# Patient Record
Sex: Female | Born: 1970 | Race: White | Hispanic: No | Marital: Married | Smoking: Former smoker
Health system: Southern US, Community
[De-identification: ages and names within clinical notes are randomized; demographics above are authoritative.]

## PROBLEM LIST (undated history)

## (undated) DIAGNOSIS — M75101 Unspecified rotator cuff tear or rupture of right shoulder, not specified as traumatic: Secondary | ICD-10-CM

## (undated) DIAGNOSIS — K200 Eosinophilic esophagitis: Secondary | ICD-10-CM

## (undated) DIAGNOSIS — D0472 Carcinoma in situ of skin of left lower limb, including hip: Secondary | ICD-10-CM

## (undated) DIAGNOSIS — M5030 Other cervical disc degeneration, unspecified cervical region: Secondary | ICD-10-CM

## (undated) DIAGNOSIS — K222 Esophageal obstruction: Secondary | ICD-10-CM

## (undated) DIAGNOSIS — F419 Anxiety disorder, unspecified: Secondary | ICD-10-CM

## (undated) DIAGNOSIS — F32A Depression, unspecified: Secondary | ICD-10-CM

## (undated) DIAGNOSIS — F329 Major depressive disorder, single episode, unspecified: Secondary | ICD-10-CM

## (undated) HISTORY — PX: OTHER SURGICAL HISTORY: SHX169

## (undated) HISTORY — PX: OVARIAN CYST SURGERY: SHX726

## (undated) HISTORY — DX: Depression, unspecified: F32.A

## (undated) HISTORY — DX: Major depressive disorder, single episode, unspecified: F32.9

## (undated) HISTORY — DX: Anxiety disorder, unspecified: F41.9

## (undated) HISTORY — PX: FOOT SURGERY: SHX648

---

## 2004-04-08 ENCOUNTER — Ambulatory Visit: Payer: Self-pay | Admitting: Podiatry

## 2010-04-10 ENCOUNTER — Emergency Department: Payer: Self-pay | Admitting: Emergency Medicine

## 2010-05-22 ENCOUNTER — Emergency Department: Payer: Self-pay | Admitting: Emergency Medicine

## 2013-06-26 ENCOUNTER — Ambulatory Visit: Payer: Self-pay | Admitting: Podiatry

## 2013-07-08 ENCOUNTER — Ambulatory Visit: Payer: Self-pay | Admitting: Obstetrics and Gynecology

## 2013-07-15 ENCOUNTER — Ambulatory Visit: Payer: Self-pay | Admitting: Obstetrics and Gynecology

## 2013-07-18 LAB — PATHOLOGY REPORT

## 2013-10-24 ENCOUNTER — Emergency Department: Payer: Self-pay | Admitting: Emergency Medicine

## 2014-02-12 ENCOUNTER — Emergency Department: Payer: Self-pay | Admitting: Emergency Medicine

## 2014-02-12 LAB — COMPREHENSIVE METABOLIC PANEL
ALK PHOS: 75 U/L
ALT: 24 U/L
ANION GAP: 9 (ref 7–16)
Albumin: 3.7 g/dL (ref 3.4–5.0)
BILIRUBIN TOTAL: 0.5 mg/dL (ref 0.2–1.0)
BUN: 13 mg/dL (ref 7–18)
CO2: 26 mmol/L (ref 21–32)
CREATININE: 0.78 mg/dL (ref 0.60–1.30)
Calcium, Total: 9 mg/dL (ref 8.5–10.1)
Chloride: 104 mmol/L (ref 98–107)
EGFR (Non-African Amer.): >60
GLUCOSE: 91 mg/dL (ref 65–99)
Osmolality: 277 (ref 275–301)
POTASSIUM: 4 mmol/L (ref 3.5–5.1)
SGOT(AST): 33 U/L (ref 15–37)
Sodium: 139 mmol/L (ref 136–145)
Total Protein: 7.3 g/dL (ref 6.4–8.2)

## 2014-02-12 LAB — CBC WITH DIFFERENTIAL/PLATELET
Basophil #: 0 10*3/uL (ref 0.0–0.1)
Basophil %: 0.6 %
EOS PCT: 2.8 %
Eosinophil #: 0.2 10*3/uL (ref 0.0–0.7)
HCT: 42.3 % (ref 35.0–47.0)
HGB: 13.9 g/dL (ref 12.0–16.0)
Lymphocyte #: 1.6 10*3/uL (ref 1.0–3.6)
Lymphocyte %: 28.6 %
MCH: 31.6 pg (ref 26.0–34.0)
MCHC: 32.8 g/dL (ref 32.0–36.0)
MCV: 96 fL (ref 80–100)
Monocyte #: 0.4 x10 3/mm (ref 0.2–0.9)
Monocyte %: 6.4 %
Neutrophil #: 3.3 10*3/uL (ref 1.4–6.5)
Neutrophil %: 61.6 %
Platelet: 226 10*3/uL (ref 150–440)
RBC: 4.4 10*6/uL (ref 3.80–5.20)
RDW: 12.3 % (ref 11.5–14.5)
WBC: 5.4 10*3/uL (ref 3.6–11.0)

## 2014-02-12 LAB — URINALYSIS, COMPLETE
BACTERIA: NONE SEEN
BILIRUBIN, UR: NEGATIVE
Blood: NEGATIVE
Glucose,UR: NEGATIVE mg/dL (ref 0–75)
Ketone: NEGATIVE
Leukocyte Esterase: NEGATIVE
Nitrite: NEGATIVE
Ph: 7 (ref 4.5–8.0)
Protein: NEGATIVE
Specific Gravity: 1.004 (ref 1.003–1.030)
WBC UR: <1 /HPF (ref 0–5)

## 2014-02-12 LAB — LIPASE, BLOOD: LIPASE: 118 U/L (ref 73–393)

## 2014-02-12 LAB — PREGNANCY, URINE: PREGNANCY TEST, URINE: NEGATIVE m[IU]/mL

## 2014-02-12 LAB — TROPONIN I: Troponin-I: <0.02 ng/mL

## 2014-09-19 NOTE — Op Note (Signed)
PATIENT NAME:  Kara Harrell, Kara Harrell MR#:  [id number] DATE OF BIRTH:  Jan 09, 1971  DATE OF PROCEDURE:  07/15/2013  PREOPERATIVE DIAGNOSIS: Pelvic pain.   POSTOPERATIVE DIAGNOSIS: Pelvic pain.  OPERATION PERFORMED: Diagnostic laparoscopy, left ovarian cystectomy, and uterine serosal biopsy.   ANESTHESIA USED: General.   PRIMARY SURGEON: Stoney Bang. Georgianne Fick, MD  ESTIMATED BLOOD LOSS: 10 mL.  OPERATIVE FLUIDS: 1100 mL of crystalloid.   URINE OUTPUT: 200 mL.   COMPLICATIONS: None.   SPECIMENS REMOVED: Left ovarian cyst as well as uterine serosal biopsy.   INTRAOPERATIVE FINDINGS: Normal uterus, other than some plaquelike hyperpigmented lesions on the posterior fundal aspect of the uterus, which were biopsied. Normal right ovary. Left ovary with a simple-appearing cyst. Anterior cul-de-sac with minimal scarring at the site of the prior hysterotomy scar. Posterior cul-de-sac and uterosacral ligaments were clear. Both ovarian fossae were visualized and appeared grossly normal. Ureters were visualized, normal caliber and peristalsing normally. Appendix visualized, normal. The liver edge was visualized and grossly normal. There was no evidence of any hernias or other etiology for the patient's pelvic pain.   PATIENT CONDITION FOLLOWING THE PROCEDURE: Stable.   PROCEDURE IN DETAIL: Risks, benefits, and alternatives of this procedure were discussed with the patient prior to proceeding to the operating room. The patient was taken to the operating room where she was placed under general anesthesia. She was positioned in the dorsal lithotomy position using Allen stirrups, prepped and draped in the usual sterile fashion. A timeout was performed. Attention was turned to the patient's pelvis. The patient's bladder was straight catheterized with a red rubber catheter, returning 200 mL of clear urine. Operative speculum was placed. The anterior lip of the cervix was visualized, grasped with a single-tooth  tenaculum, and a Hulka tenaculum was placed without difficulty. The single-tooth tenaculum and operative speculum were removed. Attention was turned to the patient's abdomen. The umbilicus was infiltrated with 1% lidocaine. A stab incision was made at the base of the umbilicus, and a 5 mm XCEL trocar was used to gain entry into the peritoneal cavity using direct visualization. Following this, 2 lateral assistant ports were placed under visualization. These were also 5 mm XCEL ports. Inspection of the abdomen revealed the above findings. Approximately a 3 to 4 cm left adnexal cyst on the left ovary was incised using a 5 mm Harmonic. The cyst wall was peeled out of the cyst cavity and sent to pathology for analysis. The uterine serosal plaques were biopsied using biopsy forceps. The pelvis was irrigated. Hemostasis of the ovary was achieved using a bipolar cautery. Pneumoperitoneum was evacuated. The trocar sites were removed. Sponge, needle, and instrument counts were correct x 2. Each of the trocar sites was dressed with Dermabond.     ____________________________ Stoney Bang. Georgianne Fick, MD ams:jcm D: 07/15/2013 20:56:39 ET T: 07/15/2013 21:28:18 ET JOB#: [id number]  cc: Stoney Bang. Georgianne Fick, MD, <Dictator> Conan Bowens Madelon Lips MD ELECTRONICALLY SIGNED 07/16/2013 1:01

## 2014-11-09 ENCOUNTER — Ambulatory Visit: Payer: Self-pay | Admitting: Family Medicine

## 2014-11-11 ENCOUNTER — Ambulatory Visit (INDEPENDENT_AMBULATORY_CARE_PROVIDER_SITE_OTHER): Payer: 59 | Admitting: Family Medicine

## 2014-11-11 ENCOUNTER — Encounter: Payer: Self-pay | Admitting: Family Medicine

## 2014-11-11 VITALS — BP 110/60 | HR 89 | Resp 16 | Ht 61.5 in | Wt 98.0 lb

## 2014-11-11 DIAGNOSIS — S2000XA Contusion of breast, unspecified breast, initial encounter: Secondary | ICD-10-CM

## 2014-11-11 DIAGNOSIS — Z8619 Personal history of other infectious and parasitic diseases: Secondary | ICD-10-CM | POA: Insufficient documentation

## 2014-11-11 DIAGNOSIS — N6489 Other specified disorders of breast: Secondary | ICD-10-CM | POA: Diagnosis not present

## 2014-11-11 DIAGNOSIS — F329 Major depressive disorder, single episode, unspecified: Secondary | ICD-10-CM | POA: Insufficient documentation

## 2014-11-11 DIAGNOSIS — F418 Other specified anxiety disorders: Secondary | ICD-10-CM

## 2014-11-11 DIAGNOSIS — F419 Anxiety disorder, unspecified: Principal | ICD-10-CM

## 2014-11-11 MED ORDER — CLONAZEPAM 0.5 MG PO TABS: 0.2500 mg | ORAL_TABLET | Freq: Three times a day (TID) | ORAL | Status: DC | PRN

## 2014-11-11 MED ORDER — ESCITALOPRAM OXALATE 20 MG PO TABS: 20.0000 mg | ORAL_TABLET | Freq: Every day | ORAL | Status: DC

## 2014-11-11 NOTE — Progress Notes (Signed)
Name: Kara Harrell   MRN: [id number]    DOB: [birth year]   Date:11/11/2014       Progress Note  Subjective  Chief Complaint  Chief Complaint  Patient presents with  . Anxiety  . Depression  . Breast Pain    After fall 4 days ago     Anxiety Presents for follow-up visit. The problem has been gradually improving. Symptoms include chest pain (chest wall pain from traumatic injury), depressed mood and nervous/anxious behavior. Patient reports no feeling of choking or insomnia. Symptoms occur most days. The severity of symptoms is moderate. The symptoms are aggravated by family issues. The quality of sleep is good.   Past treatments include benzodiazephines and SSRIs. The treatment provided significant relief. Compliance with prior treatments has been good.  Depression Patient is here for follow up of Depression. Symptoms include depressed mood and frequent crying. Currently on Lexapro 20 mg daily, which seems to help with her symptoms. SHe is able to 'cope' a little better when taking Lexapro compared to prior.  Pt. Also sustained a blunt trauma to her right chest wall and right breast 5 days ago. She tried to hold onto her friend who was leaning [place] rail [place] the [place] and in the process hit her right front chest and right breast on the wooden rail. SHe experienced chest wall pain and bruising afterwards which has improved. She is taking Aleve for pain and inflammation.   Past Medical History  Diagnosis Date  . Anxiety   . Depression     Past Surgical History  Procedure Laterality Date  . Foot surgery      Family History  Problem Relation Age of Onset  . Cancer Mother     Lung  . Diabetes Sister     History   Social History  . Marital Status: Married    Spouse Name: N/A  . Number of Children: N/A  . Years of Education: N/A   Occupational History  . Not on file.   Social History Main Topics  . Smoking status: Current Every Day Smoker -- 0.50 packs/day   Types: Cigarettes  . Smokeless tobacco: Never Used  . Alcohol Use: 3.0 oz/week    5 Cans of beer per week  . Drug Use: No  . Sexual Activity: Not on file   Other Topics Concern  . Not on file   Social History Narrative  . No narrative on file     Current outpatient prescriptions:  .  clonazePAM (KLONOPIN) 0.5 MG tablet, Take 1 tablet by mouth 3 (three) times daily., Disp: , Rfl:  .  escitalopram (LEXAPRO) 10 MG tablet, Take 1 tablet by mouth daily., Disp: , Rfl:   Allergies  Allergen Reactions  . Oxycodone      Review of Systems  Respiratory: Positive for cough. Negative for wheezing.   Cardiovascular: Positive for chest pain (chest wall pain from traumatic injury).  Psychiatric/Behavioral: Positive for depression. The patient is nervous/anxious. The patient does not have insomnia.       Objective  Filed Vitals:   11/11/14 1033  BP: 110/60  Pulse: 89  Resp: 16  Height: 5' 1.5" (1.562 m)  Weight: 98 lb (44.453 kg)  SpO2: 98%    Physical Exam  Constitutional: She is oriented to person, place, and time and well-developed, well-nourished, and in no distress.  HENT:  Head: Normocephalic and atraumatic.  Cardiovascular: Normal rate and regular rhythm.   Pulmonary/Chest: Effort normal and breath sounds  normal. Right breast exhibits tenderness.    Bruising, tenderness, and swelling over the superior portion of right breast along with mild chest wall tenderness.  Neurological: She is alert and oriented to person, place, and time.  Psychiatric: Memory, affect and judgment normal.  Nursing note and vitals reviewed.      No results found for this or any previous visit (from the past 2160 hour(s)).   Assessment & Plan 1. Anxiety and depression Symptoms are stable on present therapy. Continue medications as prescribed. - clonazePAM (KLONOPIN) 0.5 MG tablet; Take 0.5 tablets (0.25 mg total) by mouth 3 (three) times daily as needed for anxiety (0.25-0.94m TID PRN  Anxiety/Insomnia).  Dispense: 90 tablet; Refill: 0 - escitalopram (LEXAPRO) 20 MG tablet; Take 1 tablet (20 mg total) by mouth at bedtime.  Dispense: 90 tablet; Refill: 0  2. Bruise of breast Brusing with accompanying swelling, which is improving. Patient was asked to apply ice packs to her chest wall and superior breast area and if she still has tenderness, she is to return next week for follow-up for consideration of a diagnostic ultrasound of right breast. She is to continue taking Aleve as needed for relief of pain and inflammation.    Rhyen Mazariego Asad A. [facility] 11/11/2014 10:44 AM

## 2015-02-15 ENCOUNTER — Ambulatory Visit: Payer: 59 | Admitting: Family Medicine

## 2015-03-03 ENCOUNTER — Ambulatory Visit (INDEPENDENT_AMBULATORY_CARE_PROVIDER_SITE_OTHER): Payer: 59 | Admitting: Family Medicine

## 2015-03-03 ENCOUNTER — Encounter: Payer: Self-pay | Admitting: Family Medicine

## 2015-03-03 VITALS — BP 116/78 | HR 94 | Temp 98.6°F | Resp 16 | Ht 62.0 in | Wt 97.6 lb

## 2015-03-03 DIAGNOSIS — F418 Other specified anxiety disorders: Secondary | ICD-10-CM | POA: Diagnosis not present

## 2015-03-03 DIAGNOSIS — F329 Major depressive disorder, single episode, unspecified: Secondary | ICD-10-CM

## 2015-03-03 DIAGNOSIS — F419 Anxiety disorder, unspecified: Principal | ICD-10-CM

## 2015-03-03 MED ORDER — CLONAZEPAM 0.5 MG PO TABS: 0.2500 mg | ORAL_TABLET | Freq: Three times a day (TID) | ORAL | Status: DC | PRN

## 2015-03-03 MED ORDER — ESCITALOPRAM OXALATE 20 MG PO TABS: 20.0000 mg | ORAL_TABLET | Freq: Every day | ORAL | Status: DC

## 2015-03-03 NOTE — Progress Notes (Signed)
Name: Kara Harrell   MRN: [id number]    DOB: December 22, 1970   Date:03/03/2015       Progress Note  Subjective  Chief Complaint  Chief Complaint  Patient presents with  . Anxiety    pt here for 3 month follow up  . Depression   Anxiety Presents for follow-up visit. Symptoms include insomnia, irritability and nervous/anxious behavior. Patient reports no chest pain. The symptoms are aggravated by family issues.   Risk factors include a major life event. Past treatments include benzodiazephines.  Depression        This is a chronic problem.  Associated symptoms include fatigue, hopelessness, insomnia, irritable and sad.  Past treatments include SSRIs - Selective serotonin reuptake inhibitors.  Compliance with treatment is good.  Previous treatment provided moderate relief.  Risk factors include major life event and stress.   Past medical history includes anxiety.     Past Medical History  Diagnosis Date  . Anxiety   . Depression     Past Surgical History  Procedure Laterality Date  . Foot surgery      Family History  Problem Relation Age of Onset  . Cancer Mother     Lung  . Diabetes Sister     Social History   Social History  . Marital Status: Married    Spouse Name: N/A  . Number of Children: N/A  . Years of Education: N/A   Occupational History  . Not on file.   Social History Main Topics  . Smoking status: Current Every Day Smoker -- 0.50 packs/day    Types: Cigarettes  . Smokeless tobacco: Never Used  . Alcohol Use: 3.0 oz/week    5 Cans of beer per week  . Drug Use: No  . Sexual Activity: Not on file   Other Topics Concern  . Not on file   Social History Narrative    Current outpatient prescriptions:  .  clonazePAM (KLONOPIN) 0.5 MG tablet, Take 0.5 tablets (0.25 mg total) by mouth 3 (three) times daily as needed for anxiety (0.25-0.54m TID PRN Anxiety/Insomnia)., Disp: 90 tablet, Rfl: 0 .  escitalopram (LEXAPRO) 20 MG tablet, Take 1 tablet (20 mg  total) by mouth at bedtime., Disp: 90 tablet, Rfl: 0  Allergies  Allergen Reactions  . Oxycodone    Review of Systems  Constitutional: Positive for irritability and fatigue.  Cardiovascular: Negative for chest pain.  Psychiatric/Behavioral: Positive for depression. The patient is nervous/anxious and has insomnia.    Objective  Filed Vitals:   03/03/15 1011  BP: 116/78  Pulse: 94  Temp: 98.6 F (37 C)  Resp: 16  Height: 5' 2"  (1.575 m)  Weight: 97 lb 9 oz (44.254 kg)  SpO2: 97%    Physical Exam  Constitutional: She is oriented to person, place, and time and well-developed, well-nourished, and in no distress. She is irritable.  Cardiovascular: Normal rate and regular rhythm.   Pulmonary/Chest: Effort normal and breath sounds normal.  Neurological: She is alert and oriented to person, place, and time.  Psychiatric: Affect and judgment normal.  Nursing note and vitals reviewed.  Assessment & Plan  1. Anxiety and depression  - escitalopram (LEXAPRO) 20 MG tablet; Take 1 tablet (20 mg total) by mouth at bedtime.  Dispense: 90 tablet; Refill: 0 - clonazePAM (KLONOPIN) 0.5 MG tablet; Take 0.5 tablets (0.25 mg total) by mouth 3 (three) times daily as needed for anxiety (0.25-0.542mTID PRN Anxiety/Insomnia).  Dispense: 90 tablet; Refill: 0   SyOkey Dupre  [facility] 03/03/2015 10:37 AM

## 2015-06-01 ENCOUNTER — Ambulatory Visit (INDEPENDENT_AMBULATORY_CARE_PROVIDER_SITE_OTHER): Payer: 59 | Admitting: Family Medicine

## 2015-06-01 ENCOUNTER — Encounter: Payer: Self-pay | Admitting: Family Medicine

## 2015-06-01 VITALS — BP 114/70 | HR 85 | Temp 98.0°F | Resp 18 | Ht 62.0 in | Wt 104.8 lb

## 2015-06-01 DIAGNOSIS — F329 Major depressive disorder, single episode, unspecified: Secondary | ICD-10-CM

## 2015-06-01 DIAGNOSIS — F4321 Adjustment disorder with depressed mood: Secondary | ICD-10-CM | POA: Insufficient documentation

## 2015-06-01 DIAGNOSIS — F32A Depression, unspecified: Secondary | ICD-10-CM

## 2015-06-01 DIAGNOSIS — J209 Acute bronchitis, unspecified: Secondary | ICD-10-CM | POA: Insufficient documentation

## 2015-06-01 DIAGNOSIS — F419 Anxiety disorder, unspecified: Secondary | ICD-10-CM | POA: Diagnosis not present

## 2015-06-01 MED ORDER — PREDNISONE 10 MG (21) PO TBPK: 10.0000 mg | ORAL_TABLET | Freq: Every day | ORAL | Status: DC

## 2015-06-01 MED ORDER — ESCITALOPRAM OXALATE 20 MG PO TABS: 20.0000 mg | ORAL_TABLET | Freq: Every day | ORAL | Status: DC

## 2015-06-01 MED ORDER — BENZONATATE 200 MG PO CAPS: 200.0000 mg | ORAL_CAPSULE | Freq: Three times a day (TID) | ORAL | Status: DC | PRN

## 2015-06-01 MED ORDER — AZITHROMYCIN 250 MG PO TABS: ORAL_TABLET | ORAL | Status: DC

## 2015-06-01 NOTE — Progress Notes (Signed)
Name: Kara Harrell   MRN: [id number]    DOB: 01/24/1971   Date:06/01/2015       Progress Note  Subjective  Chief Complaint  Chief Complaint  Patient presents with  . Follow-up    3 mo  . Anxiety    Anxiety Presents for follow-up visit. The problem has been gradually improving. Symptoms include chest pain, depressed mood, irritability, nervous/anxious behavior, panic and shortness of breath. Patient reports no insomnia, restlessness or suicidal ideas.   Past treatments include benzodiazephines. The treatment provided significant relief. Compliance with prior treatments has been good.  Cough This is a recurrent problem. Episode onset: 10 days. The cough is productive of sputum. Associated symptoms include chest pain, ear pain, postnasal drip, shortness of breath and wheezing. Pertinent negatives include no chills, fever, headaches or sore throat. She has tried OTC cough suppressant (Alka Seltzer day and night, Mucinex DM 12 hrs.) for the symptoms.  Depression        This is a chronic problem.  The problem has been gradually improving since onset.  Associated symptoms include hopelessness and sad.  Associated symptoms include does not have insomnia, no restlessness, no headaches and no suicidal ideas.     The symptoms are aggravated by family issues.  Past treatments include SSRIs - Selective serotonin reuptake inhibitors.  Past medical history includes anxiety.     Past Medical History  Diagnosis Date  . Anxiety   . Depression     Past Surgical History  Procedure Laterality Date  . Foot surgery      Family History  Problem Relation Age of Onset  . Cancer Mother     Lung  . Diabetes Sister     Social History   Social History  . Marital Status: Married    Spouse Name: N/A  . Number of Children: N/A  . Years of Education: N/A   Occupational History  . Not on file.   Social History Main Topics  . Smoking status: Current Every Day Smoker -- 0.50 packs/day    Types:  Cigarettes  . Smokeless tobacco: Never Used  . Alcohol Use: 3.0 oz/week    5 Cans of beer per week  . Drug Use: No  . Sexual Activity: Not on file   Other Topics Concern  . Not on file   Social History Narrative     Current outpatient prescriptions:  .  clonazePAM (KLONOPIN) 0.5 MG tablet, Take 0.5 tablets (0.25 mg total) by mouth 3 (three) times daily as needed for anxiety (0.25-0.70m TID PRN Anxiety/Insomnia)., Disp: 90 tablet, Rfl: 0 .  escitalopram (LEXAPRO) 20 MG tablet, Take 1 tablet (20 mg total) by mouth at bedtime., Disp: 90 tablet, Rfl: 0  Allergies  Allergen Reactions  . Oxycodone     Review of Systems  Constitutional: Positive for irritability. Negative for fever and chills.  HENT: Positive for congestion, ear pain and postnasal drip. Negative for sore throat.   Respiratory: Positive for cough, sputum production, shortness of breath and wheezing.   Cardiovascular: Positive for chest pain.  Neurological: Negative for headaches.  Psychiatric/Behavioral: Positive for depression. Negative for suicidal ideas. The patient is nervous/anxious. The patient does not have insomnia.     Objective  Filed Vitals:   06/01/15 0923  BP: 114/70  Pulse: 85  Temp: 98 F (36.7 C)  TempSrc: Oral  Resp: 18  Height: 5' 2"  (1.575 m)  Weight: 104 lb 12.8 oz (47.537 kg)  SpO2: 97%    Physical  Exam  Constitutional: She is oriented to person, place, and time and well-developed, well-nourished, and in no distress.  HENT:  Right Ear: Tympanic membrane and ear canal normal.  Left Ear: Tympanic membrane and ear canal normal.  Nose: Right sinus exhibits maxillary sinus tenderness. Left sinus exhibits maxillary sinus tenderness.  Mouth/Throat: Mucous membranes are normal. Posterior oropharyngeal erythema present.  Cardiovascular: Normal rate and regular rhythm.   Pulmonary/Chest: Effort normal and breath sounds normal.  Neurological: She is alert and oriented to person, place, and  time.  Psychiatric: Affect and judgment normal.  Nursing note and vitals reviewed.    Assessment & Plan  1. Anxiety Stable on Clonazepam 0.25-0.75m three times daily as needed.  2. Depression  - escitalopram (LEXAPRO) 20 MG tablet; Take 1 tablet (20 mg total) by mouth at bedtime.  Dispense: 90 tablet; Refill: 0  3. Acute bronchitis, unspecified organism  - azithromycin (ZITHROMAX) 250 MG tablet; 2 tabs po x day 1, then 1 tab po q day x 4 days  Dispense: 6 tablet; Refill: 0 - benzonatate (TESSALON) 200 MG capsule; Take 1 capsule (200 mg total) by mouth 3 (three) times daily as needed for cough.  Dispense: 20 capsule; Refill: 0 - predniSONE (STERAPRED UNI-PAK 21 TAB) 10 MG (21) TBPK tablet; Take 1 tablet (10 mg total) by mouth daily. 60 50 40 30 20 10  then STOP  Dispense: 21 tablet; Refill: 0   Tasneem Cormier Asad A. [facility] 06/01/2015 9:49 AM

## 2015-06-30 ENCOUNTER — Telehealth: Payer: Self-pay

## 2015-06-30 NOTE — Telephone Encounter (Signed)
Patient is experiencing coughing, fevers, "feels bad ". Symptoms present for over 2 days but much worse today. Requesting that an antibiotic be called into her pharmacy. I recommended that patient should be seen either tonight or tomorrow morning at urgent care for evaluation of her symptoms and the prescription of appropriate therapy. Verbalized agreement.

## 2015-06-30 NOTE — Telephone Encounter (Signed)
Routed to Dr. Manuella Ghazi for advice

## 2015-07-07 ENCOUNTER — Ambulatory Visit: Payer: 59 | Admitting: Family Medicine

## 2015-07-08 ENCOUNTER — Ambulatory Visit
Admission: RE | Admit: 2015-07-08 | Discharge: 2015-07-08 | Disposition: A | Discharge status: Home / Self Care | Payer: 59 | Source: Ambulatory Visit | Attending: Family Medicine | Admitting: Family Medicine

## 2015-07-08 ENCOUNTER — Encounter: Payer: Self-pay | Admitting: Family Medicine

## 2015-07-08 ENCOUNTER — Ambulatory Visit (INDEPENDENT_AMBULATORY_CARE_PROVIDER_SITE_OTHER): Payer: 59 | Admitting: Family Medicine

## 2015-07-08 VITALS — BP 112/70 | HR 116 | Temp 98.9°F | Resp 22 | Ht 62.0 in | Wt 104.9 lb

## 2015-07-08 DIAGNOSIS — R0602 Shortness of breath: Secondary | ICD-10-CM | POA: Insufficient documentation

## 2015-07-08 DIAGNOSIS — R05 Cough: Secondary | ICD-10-CM | POA: Insufficient documentation

## 2015-07-08 DIAGNOSIS — R059 Cough, unspecified: Secondary | ICD-10-CM

## 2015-07-08 MED ORDER — GUAIFENESIN-CODEINE 100-10 MG/5ML PO SYRP: 10.0000 mL | ORAL_SOLUTION | Freq: Four times a day (QID) | ORAL | Status: DC | PRN

## 2015-07-08 NOTE — Progress Notes (Signed)
Name: Kara Harrell   MRN: [id number]    DOB: 01-22-1971   Date:07/08/2015       Progress Note  Subjective  Chief Complaint  Chief Complaint  Patient presents with  . Acute Visit    Possible Pneumonia    Cough This is a new (Persistent cough X 6 weeks) problem. The cough is non-productive. Associated symptoms include chest pain (rib apin and shoulder blade pain on the right side.), chills, a fever (mainly at night), headaches and shortness of breath. Pertinent negatives include no sore throat. Treatments tried: Zithromax and Benzonatate for Sinusitis.   Schendt seen at the urgent care, was started on Z-Pak, and Tessalon for sinusitis.  Past Medical History  Diagnosis Date  . Anxiety   . Depression     Past Surgical History  Procedure Laterality Date  . Foot surgery      Family History  Problem Relation Age of Onset  . Cancer Mother     Lung  . Diabetes Sister     Social History   Social History  . Marital Status: Married    Spouse Name: N/A  . Number of Children: N/A  . Years of Education: N/A   Occupational History  . Not on file.   Social History Main Topics  . Smoking status: Current Every Day Smoker -- 0.50 packs/day    Types: Cigarettes  . Smokeless tobacco: Never Used  . Alcohol Use: 3.0 oz/week    5 Cans of beer per week  . Drug Use: No  . Sexual Activity: Not on file   Other Topics Concern  . Not on file   Social History Narrative     Current outpatient prescriptions:  .  albuterol (PROAIR HFA) 108 (90 Base) MCG/ACT inhaler, , Disp: , Rfl:  .  clonazePAM (KLONOPIN) 0.5 MG tablet, Take 0.5 tablets (0.25 mg total) by mouth 3 (three) times daily as needed for anxiety (0.25-0.81m TID PRN Anxiety/Insomnia)., Disp: 90 tablet, Rfl: 0 .  escitalopram (LEXAPRO) 20 MG tablet, Take 1 tablet (20 mg total) by mouth at bedtime., Disp: 90 tablet, Rfl: 0  Allergies  Allergen Reactions  . Oxycodone      Review of Systems  Constitutional: Positive for  fever (mainly at night) and chills.  HENT: Negative for sore throat.   Respiratory: Positive for cough and shortness of breath.   Cardiovascular: Positive for chest pain (rib apin and shoulder blade pain on the right side.).  Neurological: Positive for headaches.    Objective  Filed Vitals:   07/08/15 1345  BP: 112/70  Pulse: 116  Temp: 98.9 F (37.2 C)  TempSrc: Oral  Resp: 22  Height: 5' 2"  (1.575 m)  Weight: 104 lb 14.4 oz (47.582 kg)  SpO2: 97%    Physical Exam  Constitutional: She is well-developed, well-nourished, and in no distress.  HENT:  Nose: Right sinus exhibits maxillary sinus tenderness. Left sinus exhibits maxillary sinus tenderness.  Neck: Neck supple.  Cardiovascular: S1 normal, S2 normal and normal heart sounds.   Pulmonary/Chest: She has decreased breath sounds in the right middle field. She has wheezes in the right upper field, the right middle field and the right lower field. She has no rhonchi.  Nursing note and vitals reviewed.    Assessment & Plan  1. Cough Likely bronchitis. Rule out pneumonia by obtaining CXR. Started on Cheratussin and will follow-up. No further antibiotic therapy at this time. - DG Chest 2 View; Future - guaiFENesin-codeine (CHERATUSSIN AC) 100-10 MG/5ML  syrup; Take 10 mLs by mouth 4 (four) times daily as needed for cough.  Dispense: 200 mL; Refill: 0 - CBC with Differential   Marvine Encalade Asad A. [facility] 07/08/2015 2:03 PM

## 2015-07-09 ENCOUNTER — Telehealth: Payer: Self-pay | Admitting: Family Medicine

## 2015-07-09 LAB — CBC WITH DIFFERENTIAL/PLATELET
BASOS: 1 %
Basophils Absolute: 0 10*3/uL (ref 0.0–0.2)
EOS (ABSOLUTE): 0.2 10*3/uL (ref 0.0–0.4)
EOS: 3 %
HEMATOCRIT: 39.8 % (ref 34.0–46.6)
Hemoglobin: 13.5 g/dL (ref 11.1–15.9)
IMMATURE GRANS (ABS): 0 10*3/uL (ref 0.0–0.1)
IMMATURE GRANULOCYTES: 0 %
LYMPHS: 35 %
Lymphocytes Absolute: 2.1 10*3/uL (ref 0.7–3.1)
MCH: 32 pg (ref 26.6–33.0)
MCHC: 33.9 g/dL (ref 31.5–35.7)
MCV: 94 fL (ref 79–97)
Monocytes Absolute: 0.3 10*3/uL (ref 0.1–0.9)
Monocytes: 6 %
NEUTROS ABS: 3.2 10*3/uL (ref 1.4–7.0)
NEUTROS PCT: 55 %
Platelets: 319 10*3/uL (ref 150–379)
RBC: 4.22 x10E6/uL (ref 3.77–5.28)
RDW: 12.9 % (ref 12.3–15.4)
WBC: 5.9 10*3/uL (ref 3.4–10.8)

## 2015-07-09 NOTE — Telephone Encounter (Signed)
Pt wants to know if we have received her results and also wants to know if she can get the medication called in that Dr Manuella Ghazi was going to call depending on the results. Pt states she is feeling really bad and would like a call back.

## 2015-07-12 ENCOUNTER — Ambulatory Visit
Admission: EM | Admit: 2015-07-12 | Discharge: 2015-07-12 | Disposition: A | Discharge status: Home / Self Care | Payer: 59 | Attending: Family Medicine | Admitting: Family Medicine

## 2015-07-12 DIAGNOSIS — F17210 Nicotine dependence, cigarettes, uncomplicated: Secondary | ICD-10-CM | POA: Diagnosis not present

## 2015-07-12 DIAGNOSIS — R079 Chest pain, unspecified: Secondary | ICD-10-CM | POA: Insufficient documentation

## 2015-07-12 DIAGNOSIS — R0789 Other chest pain: Secondary | ICD-10-CM

## 2015-07-12 DIAGNOSIS — F419 Anxiety disorder, unspecified: Secondary | ICD-10-CM | POA: Diagnosis not present

## 2015-07-12 DIAGNOSIS — Z720 Tobacco use: Secondary | ICD-10-CM

## 2015-07-12 DIAGNOSIS — R21 Rash and other nonspecific skin eruption: Secondary | ICD-10-CM | POA: Insufficient documentation

## 2015-07-12 DIAGNOSIS — F329 Major depressive disorder, single episode, unspecified: Secondary | ICD-10-CM | POA: Diagnosis not present

## 2015-07-12 DIAGNOSIS — J209 Acute bronchitis, unspecified: Secondary | ICD-10-CM | POA: Diagnosis not present

## 2015-07-12 DIAGNOSIS — J069 Acute upper respiratory infection, unspecified: Secondary | ICD-10-CM | POA: Diagnosis present

## 2015-07-12 MED ORDER — MELOXICAM 15 MG PO TABS: 15.0000 mg | ORAL_TABLET | Freq: Every day | ORAL | Status: DC

## 2015-07-12 MED ORDER — HYDROCOD POLST-CPM POLST ER 10-8 MG/5ML PO SUER: 5.0000 mL | Freq: Two times a day (BID) | ORAL | Status: DC | PRN

## 2015-07-12 MED ORDER — LEVOFLOXACIN 500 MG PO TABS: 500.0000 mg | ORAL_TABLET | Freq: Every day | ORAL | Status: DC

## 2015-07-12 MED ORDER — PREDNISONE 10 MG (21) PO TBPK: ORAL_TABLET | ORAL | Status: DC

## 2015-07-12 NOTE — ED Notes (Signed)
Patient c/o cough, nasal congestion, headache, and body aches which started in December.  She has been experiencing fever/n/v/c.

## 2015-07-12 NOTE — Discharge Instructions (Signed)
Acute Bronchitis Bronchitis is when the airways that extend from the windpipe into the lungs get red, puffy, and painful (inflamed). Bronchitis often causes thick spit (mucus) to develop. This leads to a cough. A cough is the most common symptom of bronchitis. In acute bronchitis, the condition usually begins suddenly and goes away over time (usually in 2 weeks). Smoking, allergies, and asthma can make bronchitis worse. Repeated episodes of bronchitis may cause more lung problems. HOME CARE  Rest.  Drink enough fluids to keep your pee (urine) clear or pale yellow (unless you need to limit fluids as told by your doctor).  Only take over-the-counter or prescription medicines as told by your doctor.  Avoid smoking and secondhand smoke. These can make bronchitis worse. If you are a smoker, think about using nicotine gum or skin patches. Quitting smoking will help your lungs heal faster.  Reduce the chance of getting bronchitis again by:  Washing your hands often.  Avoiding people with cold symptoms.  Trying not to touch your hands to your mouth, nose, or eyes.  Follow up with your doctor as told. GET HELP IF: Your symptoms do not improve after 1 week of treatment. Symptoms include:  Cough.  Fever.  Coughing up thick spit.  Body aches.  Chest congestion.  Chills.  Shortness of breath.  Sore throat. GET HELP RIGHT AWAY IF:   You have an increased fever.  You have chills.  You have severe shortness of breath.  You have bloody thick spit (sputum).  You throw up (vomit) often.  You lose too much body fluid (dehydration).  You have a severe headache.  You faint. MAKE SURE YOU:   Understand these instructions.  Will watch your condition.  Will get help right away if you are not doing well or get worse.   This information is not intended to replace advice given to you by your health care provider. Make sure you discuss any questions you have with your health care  provider.   Document Released: 11/01/2007 Document Revised: 01/15/2013 Document Reviewed: 11/05/2012 Elsevier Interactive Patient Education 2016 [place] WHAT IS SECONDHAND SMOKE? Secondhand smoke is smoke that comes from burning tobacco. It could be the smoke from a cigarette, a pipe, or a cigar. Even if you are not the one smoking, secondhand smoke exposes you to the dangers of smoking. This is called involuntary, or passive, smoking. There are two types of secondhand smoke:  Sidestream smoke is the smoke that comes off the lighted end of a cigarette, pipe, or cigar.  This type of smoke has the highest amount of cancer-causing agents (carcinogens).  The particles in sidestream smoke are smaller. They get into your lungs more easily.  Mainstream smoke is the smoke that is exhaled by a person who is smoking.  This type of smoke is also dangerous to your health. HOW CAN SECONDHAND SMOKE AFFECT MY HEALTH? Studies show that there is no safe level of secondhand smoke. This smoke contains thousands of chemicals. At least 39 of them are known to cause cancer. Secondhand smoke can also cause many other health problems. It has been linked to:  Lung cancer.  Cancer of the voice box (larynx) or throat.  Cancer of the sinuses.  Brain cancer.  Bladder cancer.  Stomach cancer.  Breast cancer.  White blood cell cancers (lymphoma and leukemia).  Brain and liver tumors in children.  Heart disease and stroke in adults.  Pregnancy loss (miscarriage).  Diseases in children, such  as:  Asthma.  Lung infections.  Ear infections.  Sudden infant death syndrome (SIDS).  Slow growth. WHERE CAN I BE AT RISK FOR EXPOSURE TO SECONDHAND SMOKE?   For adults, the workplace is the main source of exposure to secondhand smoke.  Your workplace should have a policy separating smoking areas from nonsmoking areas.  Smoking areas should have a system for ventilating and  cleaning the air.  For children, the home may be the most dangerous place for exposure to secondhand smoke.  Children who live in apartment buildings may be at risk from smoke drifting from hallways or other people's homes.  For everyone, many public places are possible sources of exposure to secondhand smoke.  These places include restaurants, shopping centers, and parks. HOW CAN I REDUCE MY RISK FOR EXPOSURE TO SECONDHAND SMOKE? The most important thing you can do is not smoke. Discourage family members from smoking. Other ways to reduce exposure for you and your family include the following:  Keep your home smoke free.  Make sure your child care providers do not smoke.  Warn your child about the dangers of smoking and secondhand smoke.  Do not allow smoking in your car. When someone smokes in a car, all the damaging chemicals from the smoke are confined in a small area.  Avoid public places where smoking is allowed.   This information is not intended to replace advice given to you by your health care provider. Make sure you discuss any questions you have with your health care provider.   Document Released: 06/22/2004 Document Revised: 06/05/2014 Document Reviewed: 08/29/2013 Elsevier Interactive Patient Education 2016 [employer].  Steps to Quit Smoking  Smoking tobacco can be harmful to your health and can affect almost every organ in your body. Smoking puts you, and those around you, at risk for developing many serious chronic diseases. Quitting smoking is difficult, but it is one of the best things that you can do for your health. It is never too late to quit. WHAT ARE THE BENEFITS OF QUITTING SMOKING? When you quit smoking, you lower your risk of developing serious diseases and conditions, such as:  Lung cancer or lung disease, such as COPD.  Heart disease.  Stroke.  Heart attack.  Infertility.  Osteoporosis and bone fractures. Additionally, symptoms such as  coughing, wheezing, and shortness of breath may get better when you quit. You may also find that you get sick less often because your body is stronger at fighting off colds and infections. If you are pregnant, quitting smoking can help to reduce your chances of having a baby of low birth weight. HOW DO I GET READY TO QUIT? When you decide to quit smoking, create a plan to make sure that you are successful. Before you quit:  Pick a date to quit. Set a date within the next two weeks to give you time to prepare.  Write down the reasons why you are quitting. Keep this list in places where you will see it often, such as on your bathroom mirror or in your car or wallet.  Identify the people, places, things, and activities that make you want to smoke (triggers) and avoid them. Make sure to take these actions:  Throw away all cigarettes at home, at work, and in your car.  Throw away smoking accessories, such as Scientist, research (medical).  Clean your car and make sure to empty the ashtray.  Clean your home, including curtains and carpets.  Tell your family, friends, and  coworkers that you are quitting. Support from your loved ones can make quitting easier.  Talk with your health care provider about your options for quitting smoking.  Find out what treatment options are covered by your health insurance. WHAT STRATEGIES CAN I USE TO QUIT SMOKING?  Talk with your healthcare provider about different strategies to quit smoking. Some strategies include:  Quitting smoking altogether instead of gradually lessening how much you smoke over a period of time. Research shows that quitting "cold [place]" is more successful than gradually quitting.  Attending in-person counseling to help you build problem-solving skills. You are more likely to have success in quitting if you attend several counseling sessions. Even short sessions of 10 minutes can be effective.  Finding resources and support systems that can help  you to quit smoking and remain smoke-free after you quit. These resources are most helpful when you use them often. They can include:  Online chats with a Social worker.  Telephone quitlines.  Printed Furniture conservator/restorer.  Support groups or group counseling.  Text messaging programs.  Mobile phone applications.  Taking medicines to help you quit smoking. (If you are pregnant or breastfeeding, talk with your health care provider first.) Some medicines contain nicotine and some do not. Both types of medicines help with cravings, but the medicines that include nicotine help to relieve withdrawal symptoms. Your health care provider may recommend:  Nicotine patches, gum, or lozenges.  Nicotine inhalers or sprays.  Non-nicotine medicine that is taken by mouth. Talk with your health care provider about combining strategies, such as taking medicines while you are also receiving in-person counseling. Using these two strategies together makes you more likely to succeed in quitting than if you used either strategy on its own. If you are pregnant or breastfeeding, talk with your health care provider about finding counseling or other support strategies to quit smoking. Do not take medicine to help you quit smoking unless told to do so by your health care provider. WHAT THINGS CAN I DO TO MAKE IT EASIER TO QUIT? Quitting smoking might feel overwhelming at first, but there is a lot that you can do to make it easier. Take these important actions:  Reach out to your family and friends and ask that they support and encourage you during this time. Call telephone quitlines, reach out to support groups, or work with a counselor for support.  Ask people who smoke to avoid smoking around you.  Avoid places that trigger you to smoke, such as bars, parties, or smoke-break areas at work.  Spend time around people who do not smoke.  Lessen stress in your life, because stress can be a smoking trigger for some  people. To lessen stress, try:  Exercising regularly.  Deep-breathing exercises.  Yoga.  Meditating.  Performing a body scan. This involves closing your eyes, scanning your body from head to toe, and noticing which parts of your body are particularly tense. Purposefully relax the muscles in those areas.  Download or purchase mobile phone or tablet apps (applications) that can help you stick to your quit plan by providing reminders, tips, and encouragement. There are many free apps, such as QuitGuide from the [employer] Office manager for Disease Control and Prevention). You can find other support for quitting smoking (smoking cessation) through smokefree.gov and other websites. HOW WILL I FEEL WHEN I QUIT SMOKING? Within the first 24 hours of quitting smoking, you may start to feel some withdrawal symptoms. These symptoms are usually most noticeable 2-3 days after  quitting, but they usually do not last beyond 2-3 weeks. Changes or symptoms that you might experience include:  Mood swings.  Restlessness, anxiety, or irritation.  Difficulty concentrating.  Dizziness.  Strong cravings for sugary foods in addition to nicotine.  Mild weight gain.  Constipation.  Nausea.  Coughing or a sore throat.  Changes in how your medicines work in your body.  A depressed mood.  Difficulty sleeping (insomnia). After the first 2-3 weeks of quitting, you may start to notice more positive results, such as:  Improved sense of smell and taste.  Decreased coughing and sore throat.  Slower heart rate.  Lower blood pressure.  Clearer skin.  The ability to breathe more easily.  Fewer sick days. Quitting smoking is very challenging for most people. Do not get discouraged if you are not successful the first time. Some people need to make many attempts to quit before they achieve long-term success. Do your best to stick to your quit plan, and talk with your health care provider if you have any questions or  concerns.   This information is not intended to replace advice given to you by your health care provider. Make sure you discuss any questions you have with your health care provider.   Document Released: 05/09/2001 Document Revised: 09/29/2014 Document Reviewed: 09/29/2014 Elsevier Interactive Patient Education 2016 Elsevier Inc.  Chest Wall Pain Chest wall pain is pain in or around the bones and muscles of your chest. Sometimes, an injury causes this pain. Sometimes, the cause may not be known. This pain may take several weeks or longer to get better. HOME CARE Pay attention to any changes in your symptoms. Take these actions to help with your pain:  Rest as told by your doctor.  Avoid activities that cause pain. Try not to use your chest, belly (abdominal), or side muscles to lift heavy things.  If directed, apply ice to the painful area:  Put ice in a plastic bag.  Place a towel between your skin and the bag.  Leave the ice on for 20 minutes, 2-3 times per day.  Take over-the-counter and prescription medicines only as told by your doctor.  Do not use tobacco products, including cigarettes, chewing tobacco, and e-cigarettes. If you need help quitting, ask your doctor.  Keep all follow-up visits as told by your doctor. This is important. GET HELP IF:  You have a fever.  Your chest pain gets worse.  You have new symptoms. GET HELP RIGHT AWAY IF:  You feel sick to your stomach (nauseous) or you throw up (vomit).  You feel sweaty or light-headed.  You have a cough with phlegm (sputum) or you cough up blood.  You are short of breath.   This information is not intended to replace advice given to you by your health care provider. Make sure you discuss any questions you have with your health care provider.   Document Released: 11/01/2007 Document Revised: 02/03/2015 Document Reviewed: 08/10/2014 Elsevier Interactive Patient Education [employer].

## 2015-07-12 NOTE — ED Provider Notes (Addendum)
CSN: [id number]     Arrival date & time 07/12/15  1307 History   First MD Initiated Contact with Patient 07/12/15 1544    Nurses notes were reviewed. Chief Complaint  Patient presents with  . URI   Patient is here because of a cough and chest congestion and chest pain. She reports since about a week before Christmas she's been having cough and congestion. She seen her PCP Dr. Brigitte Pulse and has been to urgent care as well. Initially she was placed on Zithromax which didn't really help her much the next time start see him she was directed to urgent care because the schedule would not allow her to be seen. She states that she does not know the antibiotic he placed her on at the urgent care but was weaker anabolic Zithromax. The coughing congestion is continued she is also has had pain in the right side of her chest and chest wall and difficulty taking a deep breath when she coughs and pain in the right ribs. She saw Dr. Brigitte Pulse on 07/08/2015. At that time he obtain a chest x-ray which was normal and a CBC which is also normal. Since then she started coughing a productive cough which is yellowish-green she feels worse and has more pain on the right side that she did before when she saw him initially. She's very frustrated about being sick. A portion she starts smoking again with the death of her son in 06-Jun-2014. Her mother had lung cancer and a sister has diabetes. She's had previous for surgery she's had a cyst removed from ovary with a C-section and she suffered from anxiety and depression. Which course was exacerbated with death of her son.      (Consider location/radiation/quality/duration/timing/severity/associated sxs/prior Treatment) Patient is a 45 y.o. female presenting with URI. The history is provided by the patient. No language interpreter was used.  URI Presenting symptoms: congestion, cough, fatigue, fever and rhinorrhea   Congestion:    Location:  Chest   Interferes with sleep: yes    Interferes with eating/drinking: yes   Severity:  Moderate Onset quality:  Sudden Timing:  Constant Chronicity:  New Relieved by:  Breathing Ineffective treatments:  Breathing Associated symptoms: no myalgias and no sneezing   Risk factors: no chronic respiratory disease, no recent illness, no recent travel and no sick contacts     Past Medical History  Diagnosis Date  . Anxiety   . Depression    Past Surgical History  Procedure Laterality Date  . Foot surgery    . Cyst removed from ovary    . Cesarean section     Family History  Problem Relation Age of Onset  . Cancer Mother     Lung  . Diabetes Sister    Social History  Substance Use Topics  . Smoking status: Current Every Day Smoker -- 0.50 packs/day    Types: Cigarettes  . Smokeless tobacco: Never Used  . Alcohol Use: 3.0 oz/week    5 Cans of beer per week   OB History    No data available     Review of Systems  Constitutional: Positive for fever and fatigue.  HENT: Positive for congestion and rhinorrhea. Negative for sneezing.   Respiratory: Positive for cough.   Cardiovascular: Negative for chest pain, palpitations and leg swelling.  Musculoskeletal: Negative for myalgias.  All other systems reviewed and are negative.   Allergies  Oxycodone  Home Medications   Prior to Admission medications   Medication Sig  Start Date End Date Taking? Authorizing Provider  albuterol [facility] [place] HFA) 108 (90 Base) MCG/ACT inhaler  07/01/15 06/30/16 Yes Historical Provider, MD  escitalopram (LEXAPRO) 20 MG tablet Take 1 tablet (20 mg total) by mouth at bedtime. 06/01/15  Yes Roselee Nova, MD  chlorpheniramine-HYDROcodone (TUSSIONEX PENNKINETIC ER) 10-8 MG/5ML SUER Take 5 mLs by mouth every 12 (twelve) hours as needed for cough. 07/12/15   Frederich Cha, MD  clonazePAM (KLONOPIN) 0.5 MG tablet Take 0.5 tablets (0.25 mg total) by mouth 3 (three) times daily as needed for anxiety (0.25-0.39m TID PRN Anxiety/Insomnia). 03/03/15   SRoselee Nova MD  guaiFENesin-codeine (CHERATUSSIN AC) 100-10 MG/5ML syrup Take 10 mLs by mouth 4 (four) times daily as needed for cough. 07/08/15   SRoselee Nova MD  levofloxacin (LEVAQUIN) 500 MG tablet Take 1 tablet (500 mg total) by mouth daily. 07/12/15   EFrederich Cha MD  meloxicam (MOBIC) 15 MG tablet Take 1 tablet (15 mg total) by mouth daily. Cannot take Motrin or Aleve 07/12/15   EFrederich Cha MD  predniSONE (STERAPRED UNI-PAK 21 TAB) 10 MG (21) TBPK tablet Sig 6 tablet day 1, 5 tablets day 2, 4 tablets day 3,,3tablets day 4, 2 tablets day 5, 1 tablet day 6 take all tablets orally 07/12/15   EFrederich Cha MD   Meds Ordered and Administered this Visit  Medications - No data to display  BP 122/76 mmHg  Pulse 86  Temp(Src) 98.2 F (36.8 C) (Oral)  Resp 18  Ht 5' 3"  (1.6 m)  Wt 104 lb (47.174 kg)  BMI 18.43 kg/m2  SpO2 97%  LMP 07/08/2015 No data found.   Physical Exam  Constitutional: She is oriented to person, place, and time. She appears well-developed and well-nourished.  HENT:  Head: Normocephalic and atraumatic.  Eyes: Conjunctivae are normal. Pupils are equal, round, and reactive to light.  Neck: Normal range of motion. Neck supple. No tracheal deviation present.  Cardiovascular: Normal rate, regular rhythm and normal heart sounds.   Pulmonary/Chest: Effort normal and breath sounds normal. No respiratory distress. She has no wheezes. She exhibits tenderness and bony tenderness. She exhibits no laceration and no edema.    Musculoskeletal: Normal range of motion. She exhibits no edema.  Neurological: She is alert and oriented to person, place, and time. No cranial nerve deficit.  Skin: Skin is warm and dry. She is not diaphoretic.  Psychiatric: She has a normal mood and affect.  Vitals reviewed.   ED Course  Procedures (including critical care time)  Labs Review Labs Reviewed - No data to display  Imaging Review No results found.   Visual Acuity Review  Right  Eye Distance:   Left Eye Distance:   Bilateral Distance:    Right Eye Near:   Left Eye Near:    Bilateral Near:      Results for orders placed or performed in visit on 07/08/15  CBC with Differential  Result Value Ref Range   WBC 5.9 3.4 - 10.8 x10E3/uL   RBC 4.22 3.77 - 5.28 x10E6/uL   Hemoglobin 13.5 11.1 - 15.9 g/dL   Hematocrit 39.8 34.0 - 46.6 %   MCV 94 79 - 97 fL   MCH 32.0 26.6 - 33.0 pg   MCHC 33.9 31.5 - 35.7 g/dL   RDW 12.9 12.3 - 15.4 %   Platelets 319 150 - 379 x10E3/uL   Neutrophils 55 %   Lymphs 35 %   Monocytes 6 %  Eos 3 %   Basos 1 %   Neutrophils Absolute 3.2 1.4 - 7.0 x10E3/uL   Lymphocytes Absolute 2.1 0.7 - 3.1 x10E3/uL   Monocytes Absolute 0.3 0.1 - 0.9 x10E3/uL   EOS (ABSOLUTE) 0.2 0.0 - 0.4 x10E3/uL   Basophils Absolute 0.0 0.0 - 0.2 x10E3/uL   Immature Granulocytes 0 %   Immature Grans (Abs) 0.0 0.0 - 0.1 x10E3/uL     MDM   1. Bronchitis, acute, with bronchospasm   2. Tobacco abuse   3. Rash   4. Chest wall pain      ED ECG REPORT I, Abu Heavin H, the attending physician, personally viewed and interpreted this ECG.   Date: 07/12/2015  EKG Time: 16;15:19  Rate:70  Rhythm: normal sinus rhythm,   Axis:82  Intervals:none  ST&T Change: NON SIGNIFICANT   EKG was compared to the one on 02/12/2014 and essentially unremarkable and that time there was no significant worry about T-wave inversion reported.  Place patient on Tussionex for cough 1 teaspoon twice a day replacing her on Mobic 15 mg 1 tablet daily for chest wall pain from the irritation the cough. I talked about the need to stop smoking I think she may have an element or component of COPD. We'll place her on Levaquin 500 mg 1 tablet day continue the albuterol inhaler that she is using now place on 6 day course of prednisone. If she is not better by the by week I will strongly suggest follow-up Dr. Manuella Ghazi and she may need to be referred to pulmonologist   As far as rash that may be  caused due to anxiety or stress prednisone place on 6 days may help it.  Note: This dictation was prepared with Dragon dictation along with smaller phrase technology. Any transcriptional errors that result from this process are unintentional.  Frederich Cha, MD 07/12/15 [place], MD 07/12/15 [id number]

## 2015-07-13 NOTE — Telephone Encounter (Signed)
Returned call and left a voice message.

## 2015-07-17 ENCOUNTER — Encounter: Payer: Self-pay | Admitting: *Deleted

## 2015-07-17 ENCOUNTER — Emergency Department: Payer: 59

## 2015-07-17 DIAGNOSIS — M79604 Pain in right leg: Secondary | ICD-10-CM | POA: Insufficient documentation

## 2015-07-17 DIAGNOSIS — Z79899 Other long term (current) drug therapy: Secondary | ICD-10-CM | POA: Insufficient documentation

## 2015-07-17 DIAGNOSIS — R600 Localized edema: Secondary | ICD-10-CM | POA: Insufficient documentation

## 2015-07-17 DIAGNOSIS — Z792 Long term (current) use of antibiotics: Secondary | ICD-10-CM | POA: Diagnosis not present

## 2015-07-17 DIAGNOSIS — Z87891 Personal history of nicotine dependence: Secondary | ICD-10-CM | POA: Insufficient documentation

## 2015-07-17 DIAGNOSIS — R2243 Localized swelling, mass and lump, lower limb, bilateral: Secondary | ICD-10-CM | POA: Diagnosis present

## 2015-07-17 DIAGNOSIS — Z791 Long term (current) use of non-steroidal anti-inflammatories (NSAID): Secondary | ICD-10-CM | POA: Insufficient documentation

## 2015-07-17 DIAGNOSIS — F329 Major depressive disorder, single episode, unspecified: Secondary | ICD-10-CM | POA: Insufficient documentation

## 2015-07-17 DIAGNOSIS — R05 Cough: Secondary | ICD-10-CM | POA: Diagnosis not present

## 2015-07-17 DIAGNOSIS — F419 Anxiety disorder, unspecified: Secondary | ICD-10-CM | POA: Insufficient documentation

## 2015-07-17 DIAGNOSIS — M79605 Pain in left leg: Secondary | ICD-10-CM | POA: Insufficient documentation

## 2015-07-17 NOTE — ED Notes (Signed)
Pt arrived to ED today reporting pain in both beet and lower legs. Pt reports pain came on suddenly. Pt can still move feet and legs. Color is appropriate. Feet are warm to the touch. Pulses are equal and strong bilaterally. Pt reports swelling noted in both legs beginning today.

## 2015-07-18 ENCOUNTER — Emergency Department
Admission: EM | Admit: 2015-07-18 | Discharge: 2015-07-18 | Disposition: A | Discharge status: Home / Self Care | Payer: 59 | Attending: Emergency Medicine | Admitting: Emergency Medicine

## 2015-07-18 DIAGNOSIS — R609 Edema, unspecified: Secondary | ICD-10-CM

## 2015-07-18 MED ORDER — DIAZEPAM 2 MG PO TABS
2.0000 mg | ORAL_TABLET | Freq: Once | ORAL | Status: AC
  Administered 2015-07-18: 2 mg via ORAL
  Filled 2015-07-18: qty 1

## 2015-07-18 NOTE — Discharge Instructions (Signed)
As we discussed, your workup today was reassuring.  Though we do not know exactly what is causing your symptoms, it appears that you have no emergent medical condition at this time are safe to go home and follow up as recommended in this paperwork.  Please return immediately to the Emergency Department if you develop any new or worsening symptoms that concern you.   Peripheral Edema You have swelling in your legs (peripheral edema). This swelling is due to excess accumulation of salt and water in your body. Edema may be a sign of heart, kidney or liver disease, or a side effect of a medication. It may also be due to problems in the leg veins. Elevating your legs and using special support stockings may be very helpful, if the cause of the swelling is due to poor venous circulation. Avoid long periods of standing, whatever the cause. Treatment of edema depends on identifying the cause. Chips, pretzels, pickles and other salty foods should be avoided. Restricting salt in your diet is almost always needed. Water pills (diuretics) are often used to remove the excess salt and water from your body via urine. These medicines prevent the kidney from reabsorbing sodium. This increases urine flow. Diuretic treatment may also result in lowering of potassium levels in your body. Potassium supplements may be needed if you have to use diuretics daily. Daily weights can help you keep track of your progress in clearing your edema. You should call your caregiver for follow up care as recommended. SEEK IMMEDIATE MEDICAL CARE IF:   You have increased swelling, pain, redness, or heat in your legs.  You develop shortness of breath, especially when lying down.  You develop chest or abdominal pain, weakness, or fainting.  You have a fever.   This information is not intended to replace advice given to you by your health care provider. Make sure you discuss any questions you have with your health care provider.     Document Released: 06/22/2004 Document Revised: 08/07/2011 Document Reviewed: 11/25/2014 Elsevier Interactive Patient Education [employer].

## 2015-07-18 NOTE — ED Provider Notes (Signed)
[facility] Emergency Department Provider Note  ____________________________________________  Time seen: Approximately 1:10 AM  I have reviewed the triage vital signs and the nursing notes.   HISTORY  Chief Complaint Leg Swelling    HPI Kara Harrell is a 45 y.o. female with a past medical history significant for anxiety and depression and who recently experienced a 1 year anniversary of the death of her son.  She presents tonight with acute onset bilateral lower extremity pain.  She states that this started within the last 24 hours.  She had no trauma.  She reports that her legs are swollen from the knees down to her feet and that they were both sore, which she described as a severe ache.  She was not having any cramping.  She tried to get rest today and keep the legs elevated which did help but given the persistent discomfort she thought she should come to be evaluated.  The swelling has completely resolved and the pain is improved to mild.  Rest and elevation made it better, weightbearing seems to make it worse.  She has no history of blood clots.  She denies fever/chills, chest pain, shortness of breath, abdominal pain, nausea, vomiting, diarrhea.  She has been dealing with chronic bronchitis for nearly 2 months now but that is stable.   Past Medical History  Diagnosis Date  . Anxiety   . Depression     Patient Active Problem List   Diagnosis Date Noted  . Cough 07/08/2015  . Anxiety 06/01/2015  . Depression 06/01/2015  . Bronchitis, acute 06/01/2015  . Anxiety and depression 11/11/2014    Past Surgical History  Procedure Laterality Date  . Foot surgery    . Cyst removed from ovary    . Cesarean section      Current Outpatient Rx  Name  Route  Sig  Dispense  Refill  . albuterol (PROAIR HFA) 108 (90 Base) MCG/ACT inhaler               . chlorpheniramine-HYDROcodone (TUSSIONEX PENNKINETIC ER) 10-8 MG/5ML SUER   Oral   Take 5 mLs by mouth  every 12 (twelve) hours as needed for cough.   115 mL   0   . clonazePAM (KLONOPIN) 0.5 MG tablet   Oral   Take 0.5 tablets (0.25 mg total) by mouth 3 (three) times daily as needed for anxiety (0.25-0.52m TID PRN Anxiety/Insomnia).   90 tablet   0   . escitalopram (LEXAPRO) 20 MG tablet   Oral   Take 1 tablet (20 mg total) by mouth at bedtime.   90 tablet   0   . guaiFENesin-codeine (CHERATUSSIN AC) 100-10 MG/5ML syrup   Oral   Take 10 mLs by mouth 4 (four) times daily as needed for cough.   200 mL   0   . levofloxacin (LEVAQUIN) 500 MG tablet   Oral   Take 1 tablet (500 mg total) by mouth daily.   10 tablet   0   . meloxicam (MOBIC) 15 MG tablet   Oral   Take 1 tablet (15 mg total) by mouth daily. Cannot take Motrin or Aleve   30 tablet   0   . predniSONE (STERAPRED UNI-PAK 21 TAB) 10 MG (21) TBPK tablet      Sig 6 tablet day 1, 5 tablets day 2, 4 tablets day 3,,3tablets day 4, 2 tablets day 5, 1 tablet day 6 take all tablets orally   21 tablet  0     Allergies Review of patient's allergies indicates no active allergies.  Family History  Problem Relation Age of Onset  . Cancer Mother     Lung  . Diabetes Sister     Social History Social History  Substance Use Topics  . Smoking status: Former Smoker -- 0.00 packs/day  . Smokeless tobacco: Never Used  . Alcohol Use: 3.0 oz/week    5 Cans of beer per week    Review of Systems Constitutional: No fever/chills Eyes: No visual changes. ENT: No sore throat. Cardiovascular: Denies chest pain. Respiratory: Denies shortness of breath.  Chronic cough Gastrointestinal: No abdominal pain.  No nausea, no vomiting.  No diarrhea.  No constipation. Genitourinary: Negative for dysuria. Musculoskeletal: Tendon swelling in bilateral lower extremities, now improved Skin: Negative for rash. Neurological: Negative for headaches, focal weakness or numbness.  10-point ROS otherwise  negative.  ____________________________________________   PHYSICAL EXAM:  VITAL SIGNS: ED Triage Vitals  Enc Vitals Group     BP 07/17/15 2112 112/70 mmHg     Pulse Rate 07/17/15 2112 73     Resp 07/17/15 2112 16     Temp 07/17/15 2112 98.2 F (36.8 C)     Temp Source 07/17/15 2112 Oral     SpO2 07/17/15 2112 99 %     Weight 07/17/15 2112 104 lb (47.174 kg)     Height 07/17/15 2112 5' 3"  (1.6 m)     Head Cir --      Peak Flow --      Pain Score 07/17/15 2113 7     Pain Loc --      Pain Edu? --      Excl. in [place]? --     Constitutional: Alert and oriented. Well appearing and in no acute distress.  Healthy body habitus Eyes: Conjunctivae are normal. PERRL. EOMI. Head: Atraumatic. Neck: No stridor.   Cardiovascular: Normal rate, regular rhythm. Grossly normal heart sounds.  Good peripheral circulation. Respiratory: Normal respiratory effort.  No retractions. Lungs CTAB. Gastrointestinal: Soft and nontender. No distention. No abdominal bruits. No CVA tenderness. Musculoskeletal: No lower extremity tenderness nor edema.  No joint effusions.  Normal weightbearing and range of motion of lower extremities. Neurologic:  Normal speech and language. No gross focal neurologic deficits are appreciated.  Skin:  Skin is warm, dry and intact. No rash noted.  No ecchymoses.  No petechiae. Psychiatric: Mood and affect are slightly depressed and anxious but essentially normal.  Speech and behavior are normal.  ____________________________________________   LABS (all labs ordered are listed, but only abnormal results are displayed)  Labs Reviewed - No data to display ____________________________________________  EKG  None ____________________________________________  RADIOLOGY   US Venous Img Lower Bilateral  07/17/2015  CLINICAL DATA:  Bilateral leg pain and swelling for 1 day. EXAM: BILATERAL LOWER EXTREMITY VENOUS DOPPLER ULTRASOUND TECHNIQUE: Gray-scale sonography with graded  compression, as well as color Doppler and duplex ultrasound were performed to evaluate the lower extremity deep venous systems from the level of the common femoral vein and including the common femoral, femoral, profunda femoral, popliteal and calf veins including the posterior tibial, peroneal and gastrocnemius veins when visible. The superficial great saphenous vein was also interrogated. Spectral Doppler was utilized to evaluate flow at rest and with distal augmentation maneuvers in the common femoral, femoral and popliteal veins. COMPARISON:  None. FINDINGS: RIGHT LOWER EXTREMITY Common Femoral Vein: No evidence of thrombus. Normal compressibility, respiratory phasicity and response to augmentation. Saphenofemoral Junction:  No evidence of thrombus. Normal compressibility and flow on color Doppler imaging. Profunda Femoral Vein: No evidence of thrombus. Normal compressibility and flow on color Doppler imaging. Femoral Vein: No evidence of thrombus. Normal compressibility, respiratory phasicity and response to augmentation. Popliteal Vein: No evidence of thrombus. Normal compressibility, respiratory phasicity and response to augmentation. Calf Veins: No evidence of thrombus. Normal compressibility and flow on color Doppler imaging. Superficial Great Saphenous Vein: No evidence of thrombus. Normal compressibility and flow on color Doppler imaging. Venous Reflux:  None. Other Findings:  None. LEFT LOWER EXTREMITY Common Femoral Vein: No evidence of thrombus. Normal compressibility, respiratory phasicity and response to augmentation. Saphenofemoral Junction: No evidence of thrombus. Normal compressibility and flow on color Doppler imaging. Profunda Femoral Vein: No evidence of thrombus. Normal compressibility and flow on color Doppler imaging. Femoral Vein: No evidence of thrombus. Normal compressibility, respiratory phasicity and response to augmentation. Popliteal Vein: No evidence of thrombus. Normal  compressibility, respiratory phasicity and response to augmentation. Calf Veins: No evidence of thrombus. Normal compressibility and flow on color Doppler imaging. Superficial Great Saphenous Vein: No evidence of thrombus. Normal compressibility and flow on color Doppler imaging. Venous Reflux:  None. Other Findings:  None. IMPRESSION: No evidence of bilateral lower extremity deep venous thrombosis. Electronically Signed   By: Jeb Levering M.D.   On: 07/17/2015 22:55    ____________________________________________   PROCEDURES  Procedure(s) performed: None  Critical Care performed: No ____________________________________________   INITIAL IMPRESSION / ASSESSMENT AND PLAN / ED COURSE  Pertinent labs & imaging results that were available during my care of the patient were reviewed by me and considered in my medical decision making (see chart for details).  The patient's physical exam is quite reassuring.  There is no evidence of blood clot on her ultrasound and she is at low risk based on history provided.  She is not describing any cramping and has had no other reasons to suggest a gross electrolyte abnormality.  It sounds like she had some peripheral edema and resultant discomfort which improved with rest and elevation.  I provided reassurance and encouraged her to follow up with her regular doctor.  I provided a small dose of Valium to help with the leg discomfort and told her to not take her regular Klonopin or hydrocodone cough medicine tonight.  She will follow up with her regular doctor next week.  There is no evidence of emergent medical condition on her medical screening exam and she and her husband agree with my plan for outpatient follow-up.  ____________________________________________  FINAL CLINICAL IMPRESSION(S) / ED DIAGNOSES  Final diagnoses:  Peripheral edema      NEW MEDICATIONS STARTED DURING THIS VISIT:  New Prescriptions   No medications on file     Hinda Kehr, MD 07/18/15 0139

## 2015-08-04 ENCOUNTER — Encounter: Payer: Self-pay | Admitting: Family Medicine

## 2015-08-04 ENCOUNTER — Ambulatory Visit (INDEPENDENT_AMBULATORY_CARE_PROVIDER_SITE_OTHER): Payer: 59 | Admitting: Family Medicine

## 2015-08-04 VITALS — BP 102/68 | HR 88 | Temp 98.7°F | Resp 18 | Ht 63.0 in | Wt 105.1 lb

## 2015-08-04 DIAGNOSIS — R1013 Epigastric pain: Secondary | ICD-10-CM | POA: Diagnosis not present

## 2015-08-04 DIAGNOSIS — R1310 Dysphagia, unspecified: Secondary | ICD-10-CM

## 2015-08-04 DIAGNOSIS — Z23 Encounter for immunization: Secondary | ICD-10-CM

## 2015-08-04 MED ORDER — OMEPRAZOLE 40 MG PO CPDR: 40.0000 mg | DELAYED_RELEASE_CAPSULE | Freq: Two times a day (BID) | ORAL | Status: DC

## 2015-08-04 MED ORDER — FLUCONAZOLE 150 MG PO TABS: 150.0000 mg | ORAL_TABLET | Freq: Every day | ORAL | Status: DC

## 2015-08-04 NOTE — Progress Notes (Signed)
Name: Kara Harrell   MRN: [id number]    DOB: 1971-03-25   Date:08/04/2015       Progress Note  Subjective  Chief Complaint  Chief Complaint  Patient presents with  . Gastroesophageal Reflux    pt has been having issues for 2 weeks.     HPI  Dysphagia/Odynophagia/epigastric pain: she does not have a history of GERD. She was very sick with bronchitis in January of 2017 and was given Z-pack and cough medication, went back to Urgent Care in Feb 2017 and was given Levaquin, Prednisone taper, meloxicam ( for pulled chest muscle) and Proair plus cough medication . She states over the past 2 weeks developed dysphagia solids and liquids with severe odynophagia and epigastric pain. She has been taking Tums, apple sauce and vinegar. She has also noticed some white spots in her tongue and there is always something stuck in her upper esophagus, also having for eructation. Appetite is poor. She has also noticed some nausea, and multiple episodes of regurgitation. No fever, no chills and no weight loss.    Patient Active Problem List   Diagnosis Date Noted  . Anxiety 06/01/2015  . Depression 06/01/2015  . Anxiety and depression 11/11/2014    Past Surgical History  Procedure Laterality Date  . Foot surgery    . Cyst removed from ovary    . Cesarean section      Family History  Problem Relation Age of Onset  . Cancer Mother     Lung  . Diabetes Sister     Social History   Social History  . Marital Status: Married    Spouse Name: N/A  . Number of Children: N/A  . Years of Education: N/A   Occupational History  . Not on file.   Social History Main Topics  . Smoking status: Former Smoker -- 0.00 packs/day  . Smokeless tobacco: Never Used  . Alcohol Use: 3.0 oz/week    5 Cans of beer per week  . Drug Use: No  . Sexual Activity: Not on file   Other Topics Concern  . Not on file   Social History Narrative     Current outpatient prescriptions:  .  clonazePAM (KLONOPIN) 0.5  MG tablet, Take 0.5 tablets (0.25 mg total) by mouth 3 (three) times daily as needed for anxiety (0.25-0.20m TID PRN Anxiety/Insomnia)., Disp: 90 tablet, Rfl: 0 .  escitalopram (LEXAPRO) 20 MG tablet, Take 1 tablet (20 mg total) by mouth at bedtime., Disp: 90 tablet, Rfl: 0 .  fluconazole (DIFLUCAN) 150 MG tablet, Take 1 tablet (150 mg total) by mouth daily., Disp: 7 tablet, Rfl: 0 .  omeprazole (PRILOSEC) 40 MG capsule, Take 1 capsule (40 mg total) by mouth 2 (two) times daily., Disp: 60 capsule, Rfl: 0  No Known Allergies   ROS  Ten systems reviewed and is negative except as mentioned in HPI and below She still has occasional cough, and still has pain on left chest wall, no change in bowel movements  Objective  Filed Vitals:   08/04/15 0857  BP: 102/68  Pulse: 125  Temp: 98.7 F (37.1 C)  Resp: 18  Height: 5' 3"  (1.6 m)  Weight: 105 lb 1 oz (47.656 kg)  SpO2: 97%    Body mass index is 18.62 kg/(m^2).  Physical Exam  Constitutional: Patient appears well-developed and well-nourished. No distress.  HEENT: head atraumatic, normocephalic, pupils equal and reactive to light, neck supple, mild trush Cardiovascular: Normal rate, regular rhythm and normal heart  sounds.  No murmur heard. No BLE edema. Pulmonary/Chest: Effort normal and breath sounds normal. No respiratory distress. Abdominal: Soft.  There is tenderness during palpation of epigastric area. Psychiatric: Patient has a normal mood and affect. behavior is normal. Judgment and thought content normal.  Recent Results (from the past 2160 hour(s))  CBC with Differential     Status: None   Collection Time: 07/08/15  2:54 PM  Result Value Ref Range   WBC 5.9 3.4 - 10.8 x10E3/uL   RBC 4.22 3.77 - 5.28 x10E6/uL   Hemoglobin 13.5 11.1 - 15.9 g/dL   Hematocrit 39.8 34.0 - 46.6 %   MCV 94 79 - 97 fL   MCH 32.0 26.6 - 33.0 pg   MCHC 33.9 31.5 - 35.7 g/dL   RDW 12.9 12.3 - 15.4 %   Platelets 319 150 - 379 x10E3/uL    Neutrophils 55 %   Lymphs 35 %   Monocytes 6 %   Eos 3 %   Basos 1 %   Neutrophils Absolute 3.2 1.4 - 7.0 x10E3/uL   Lymphocytes Absolute 2.1 0.7 - 3.1 x10E3/uL   Monocytes Absolute 0.3 0.1 - 0.9 x10E3/uL   EOS (ABSOLUTE) 0.2 0.0 - 0.4 x10E3/uL   Basophils Absolute 0.0 0.0 - 0.2 x10E3/uL   Immature Granulocytes 0 %   Immature Grans (Abs) 0.0 0.0 - 0.1 x10E3/uL     PHQ2/9: Depression screen [facility] 2/9 08/04/2015 07/08/2015 06/01/2015  Decreased Interest 0 0 0  Down, Depressed, Hopeless 0 0 0  PHQ - 2 Score 0 0 0     Fall Risk: Fall Risk  08/04/2015 07/08/2015 06/01/2015  Falls in the past year? No No No     Functional Status Survey: Is the patient deaf or have difficulty hearing?: No Does the patient have difficulty seeing, even when wearing glasses/contacts?: No Does the patient have difficulty concentrating, remembering, or making decisions?: No Does the patient have difficulty walking or climbing stairs?: No Does the patient have difficulty dressing or bathing?: No Does the patient have difficulty doing errands alone such as visiting a doctor's office or shopping?: No    Assessment & Plan  1. Dysphagia  Possible esophagitis, we will treat with Diflucan and prilosec and refer to GI for possible EGD if symptoms do not resolve - fluconazole (DIFLUCAN) 150 MG tablet; Take 1 tablet (150 mg total) by mouth daily.  Dispense: 7 tablet; Refill: 0 - omeprazole (PRILOSEC) 40 MG capsule; Take 1 capsule (40 mg total) by mouth 2 (two) times daily.  Dispense: 60 capsule; Refill: 0  2. Odynophagia  - fluconazole (DIFLUCAN) 150 MG tablet; Take 1 tablet (150 mg total) by mouth daily.  Dispense: 7 tablet; Refill: 0 - omeprazole (PRILOSEC) 40 MG capsule; Take 1 capsule (40 mg total) by mouth 2 (two) times daily.  Dispense: 60 capsule; Refill: 0  3. Epigastric pain  - fluconazole (DIFLUCAN) 150 MG tablet; Take 1 tablet (150 mg total) by mouth daily.  Dispense: 7 tablet; Refill: 0 - omeprazole  (PRILOSEC) 40 MG capsule; Take 1 capsule (40 mg total) by mouth 2 (two) times daily.  Dispense: 60 capsule; Refill: 0 - omeprazole (PRILOSEC) 40 MG capsule; Take 1 capsule (40 mg total) by mouth 2 (two) times daily.  Dispense: 30 capsule; Refill: 0  4. Needs flu shot  - Flu Vaccine QUAD 36+ mos IM

## 2015-08-18 ENCOUNTER — Ambulatory Visit (INDEPENDENT_AMBULATORY_CARE_PROVIDER_SITE_OTHER): Payer: 59 | Admitting: Family Medicine

## 2015-08-18 ENCOUNTER — Ambulatory Visit: Payer: 59 | Admitting: Family Medicine

## 2015-08-18 ENCOUNTER — Encounter: Payer: Self-pay | Admitting: Family Medicine

## 2015-08-18 VITALS — BP 100/64 | HR 102 | Temp 98.2°F | Resp 16 | Ht 63.0 in | Wt 102.2 lb

## 2015-08-18 DIAGNOSIS — R1310 Dysphagia, unspecified: Secondary | ICD-10-CM | POA: Diagnosis not present

## 2015-08-18 DIAGNOSIS — Z23 Encounter for immunization: Secondary | ICD-10-CM

## 2015-08-18 DIAGNOSIS — F321 Major depressive disorder, single episode, moderate: Secondary | ICD-10-CM

## 2015-08-18 MED ORDER — CLONAZEPAM 0.5 MG PO TABS: 0.2500 mg | ORAL_TABLET | Freq: Three times a day (TID) | ORAL | Status: DC | PRN

## 2015-08-18 MED ORDER — ESCITALOPRAM OXALATE 20 MG PO TABS: 20.0000 mg | ORAL_TABLET | Freq: Every day | ORAL | Status: DC
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
## 2015-08-23 ENCOUNTER — Ambulatory Visit: Payer: 59 | Admitting: Family Medicine

## 2015-08-24 ENCOUNTER — Ambulatory Visit
Admission: RE | Admit: 2015-08-24 | Discharge: 2015-08-24 | Disposition: A | Discharge status: Home / Self Care | Payer: 59 | Source: Ambulatory Visit | Attending: Family Medicine | Admitting: Family Medicine

## 2015-08-24 DIAGNOSIS — K219 Gastro-esophageal reflux disease without esophagitis: Secondary | ICD-10-CM | POA: Insufficient documentation

## 2015-08-24 DIAGNOSIS — R1310 Dysphagia, unspecified: Secondary | ICD-10-CM | POA: Diagnosis present

## 2015-08-25 ENCOUNTER — Telehealth: Payer: Self-pay | Admitting: Family Medicine

## 2015-08-25 NOTE — Telephone Encounter (Signed)
Patient was informed and was encouraged to keep her appointment with Dr. Allen Norris on 09/06/15.

## 2015-08-25 NOTE — Telephone Encounter (Signed)
Patient returning your call [phone]

## 2015-09-06 ENCOUNTER — Encounter: Payer: Self-pay | Admitting: Gastroenterology

## 2015-09-06 ENCOUNTER — Ambulatory Visit (INDEPENDENT_AMBULATORY_CARE_PROVIDER_SITE_OTHER): Payer: 59 | Admitting: Gastroenterology

## 2015-09-06 VITALS — BP 142/66 | HR 68 | Temp 98.3°F | Ht 63.0 in | Wt 104.0 lb

## 2015-09-06 DIAGNOSIS — F458 Other somatoform disorders: Secondary | ICD-10-CM | POA: Diagnosis not present

## 2015-09-06 DIAGNOSIS — R1310 Dysphagia, unspecified: Secondary | ICD-10-CM | POA: Diagnosis not present
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
## 2015-09-27 ENCOUNTER — Telehealth: Payer: Self-pay | Admitting: Gastroenterology

## 2015-09-27 NOTE — Telephone Encounter (Signed)
Patient called and said she used all the [place] and doesn't seem to be any better. Please advise.

## 2015-09-29 ENCOUNTER — Other Ambulatory Visit: Payer: Self-pay

## 2015-09-29 NOTE — Telephone Encounter (Signed)
Pt scheduled for EGD at [facility] on 10/18/15. Instructs/rx mailed. Please precert for GERD: U03.7

## 2015-10-04 ENCOUNTER — Ambulatory Visit (INDEPENDENT_AMBULATORY_CARE_PROVIDER_SITE_OTHER): Payer: 59 | Admitting: Family Medicine

## 2015-10-04 ENCOUNTER — Encounter: Payer: Self-pay | Admitting: Family Medicine

## 2015-10-04 ENCOUNTER — Ambulatory Visit: Payer: 59 | Admitting: Family Medicine

## 2015-10-04 VITALS — BP 110/68 | HR 96 | Temp 97.8°F | Resp 16 | Ht 63.0 in | Wt 106.8 lb

## 2015-10-04 DIAGNOSIS — Z13220 Encounter for screening for lipoid disorders: Secondary | ICD-10-CM | POA: Diagnosis not present

## 2015-10-04 DIAGNOSIS — F321 Major depressive disorder, single episode, moderate: Secondary | ICD-10-CM

## 2015-10-04 DIAGNOSIS — R5383 Other fatigue: Secondary | ICD-10-CM

## 2015-10-04 DIAGNOSIS — Z124 Encounter for screening for malignant neoplasm of cervix: Secondary | ICD-10-CM | POA: Diagnosis not present

## 2015-10-04 DIAGNOSIS — Z131 Encounter for screening for diabetes mellitus: Secondary | ICD-10-CM | POA: Diagnosis not present

## 2015-10-04 DIAGNOSIS — Z0000 Encounter for general adult medical examination without abnormal findings: Secondary | ICD-10-CM

## 2015-10-04 DIAGNOSIS — L509 Urticaria, unspecified: Secondary | ICD-10-CM | POA: Diagnosis not present

## 2015-10-04 DIAGNOSIS — N926 Irregular menstruation, unspecified: Secondary | ICD-10-CM | POA: Diagnosis not present

## 2015-10-04 DIAGNOSIS — Z1239 Encounter for other screening for malignant neoplasm of breast: Secondary | ICD-10-CM | POA: Diagnosis not present

## 2015-10-04 DIAGNOSIS — Z01419 Encounter for gynecological examination (general) (routine) without abnormal findings: Secondary | ICD-10-CM

## 2015-10-04 MED ORDER — LEVOCETIRIZINE DIHYDROCHLORIDE 5 MG PO TABS: 5.0000 mg | ORAL_TABLET | Freq: Every evening | ORAL | Status: DC

## 2015-10-04 MED ORDER — DULOXETINE HCL 30 MG PO CPEP: 30.0000 mg | ORAL_CAPSULE | Freq: Every day | ORAL | Status: DC

## 2015-10-04 MED ORDER — HYDROXYZINE HCL 25 MG PO TABS: 25.0000 mg | ORAL_TABLET | Freq: Every evening | ORAL | Status: DC | PRN

## 2015-10-04 NOTE — Patient Instructions (Signed)
Wean self off Lexapro by taking half pill for one week overlapping with one capsure of Cymbalta, after that that one quarter pill of Lexapro for another week and take 2 capsules of Cymbalta.
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
## 2015-10-06 LAB — COMPREHENSIVE METABOLIC PANEL
A/G RATIO: 1.7 (ref 1.2–2.2)
ALK PHOS: 72 IU/L (ref 39–117)
ALT: 14 IU/L (ref 0–32)
AST: 22 IU/L (ref 0–40)
Albumin: 4.5 g/dL (ref 3.5–5.5)
BILIRUBIN TOTAL: 0.5 mg/dL (ref 0.0–1.2)
BUN/Creatinine Ratio: 18 (ref 9–23)
BUN: 13 mg/dL (ref 6–24)
CHLORIDE: 97 mmol/L (ref 96–106)
CO2: 23 mmol/L (ref 18–29)
Calcium: 9.2 mg/dL (ref 8.7–10.2)
Creatinine, Ser: 0.74 mg/dL (ref 0.57–1.00)
GFR calc Af Amer: 112 mL/min/{1.73_m2} (ref >59)
GFR, EST NON AFRICAN AMERICAN: 97 mL/min/{1.73_m2} (ref >59)
GLOBULIN, TOTAL: 2.6 g/dL (ref 1.5–4.5)
Glucose: 137 mg/dL — ABNORMAL HIGH (ref 65–99)
POTASSIUM: 4 mmol/L (ref 3.5–5.2)
SODIUM: 138 mmol/L (ref 134–144)
Total Protein: 7.1 g/dL (ref 6.0–8.5)

## 2015-10-06 LAB — PAPLB, HPV, RFX16/18
HPV, high-risk: NEGATIVE
PAP Smear Comment: 0

## 2015-10-06 LAB — LIPID PANEL
CHOL/HDL RATIO: 2.6 ratio (ref 0.0–4.4)
CHOLESTEROL TOTAL: 269 mg/dL — AB (ref 100–199)
HDL: 103 mg/dL (ref >39)
LDL CALC: 132 mg/dL — AB (ref 0–99)
TRIGLYCERIDES: 172 mg/dL — AB (ref 0–149)
VLDL Cholesterol Cal: 34 mg/dL (ref 5–40)

## 2015-10-06 LAB — HEMOGLOBIN A1C
ESTIMATED AVERAGE GLUCOSE: 105 mg/dL
HEMOGLOBIN A1C: 5.3 % (ref 4.8–5.6)

## 2015-10-06 LAB — TSH: TSH: 1.96 u[IU]/mL (ref 0.450–4.500)

## 2015-10-06 LAB — VITAMIN D 25 HYDROXY (VIT D DEFICIENCY, FRACTURES): VIT D 25 HYDROXY: 33.9 ng/mL (ref 30.0–100.0)

## 2015-10-06 LAB — VITAMIN B12: VITAMIN B 12: 672 pg/mL (ref 211–946)

## 2015-10-12 ENCOUNTER — Encounter: Payer: Self-pay | Admitting: *Deleted

## 2015-10-15 NOTE — Discharge Instructions (Signed)

## 2015-10-18 ENCOUNTER — Ambulatory Visit
Admission: RE | Admit: 2015-10-18 | Discharge: 2015-10-18 | Disposition: A | Discharge status: Home / Self Care | Payer: 59 | Source: Ambulatory Visit | Attending: Gastroenterology | Admitting: Gastroenterology

## 2015-10-18 ENCOUNTER — Ambulatory Visit: Payer: 59 | Admitting: Anesthesiology

## 2015-10-18 ENCOUNTER — Encounter
Admission: RE | Disposition: A | Discharge status: Home / Self Care | Payer: Self-pay | Source: Ambulatory Visit | Attending: Gastroenterology

## 2015-10-18 ENCOUNTER — Encounter: Payer: Self-pay | Admitting: *Deleted

## 2015-10-18 DIAGNOSIS — F329 Major depressive disorder, single episode, unspecified: Secondary | ICD-10-CM | POA: Diagnosis not present

## 2015-10-18 DIAGNOSIS — Z801 Family history of malignant neoplasm of trachea, bronchus and lung: Secondary | ICD-10-CM | POA: Insufficient documentation

## 2015-10-18 DIAGNOSIS — R1310 Dysphagia, unspecified: Secondary | ICD-10-CM | POA: Diagnosis not present

## 2015-10-18 DIAGNOSIS — F419 Anxiety disorder, unspecified: Secondary | ICD-10-CM | POA: Insufficient documentation

## 2015-10-18 DIAGNOSIS — F17210 Nicotine dependence, cigarettes, uncomplicated: Secondary | ICD-10-CM | POA: Insufficient documentation

## 2015-10-18 DIAGNOSIS — K222 Esophageal obstruction: Secondary | ICD-10-CM | POA: Diagnosis not present

## 2015-10-18 DIAGNOSIS — K2970 Gastritis, unspecified, without bleeding: Secondary | ICD-10-CM | POA: Diagnosis not present

## 2015-10-18 DIAGNOSIS — Z833 Family history of diabetes mellitus: Secondary | ICD-10-CM | POA: Insufficient documentation

## 2015-10-18 DIAGNOSIS — Z79899 Other long term (current) drug therapy: Secondary | ICD-10-CM | POA: Diagnosis not present

## 2015-10-18 HISTORY — PX: ESOPHAGOGASTRODUODENOSCOPY (EGD) WITH PROPOFOL: SHX5813

## 2015-10-18 SURGERY — ESOPHAGOGASTRODUODENOSCOPY (EGD) WITH PROPOFOL
Anesthesia: Monitor Anesthesia Care | Wound class: Clean Contaminated

## 2015-10-18 MED ORDER — ACETAMINOPHEN 160 MG/5ML PO SOLN
325.0000 mg | ORAL | Status: DC | PRN
Start: 2015-10-18 — End: 2015-10-18

## 2015-10-18 MED ORDER — ONDANSETRON HCL 4 MG/2ML IJ SOLN: 4.0000 mg | Freq: Once | INTRAMUSCULAR | Status: DC | PRN

## 2015-10-18 MED ORDER — STERILE WATER FOR IRRIGATION IR SOLN
Status: DC | PRN
  Administered 2015-10-18: 10:00:00

## 2015-10-18 MED ORDER — LIDOCAINE HCL (CARDIAC) 20 MG/ML IV SOLN
INTRAVENOUS | Status: DC | PRN
  Administered 2015-10-18: 40 mg via INTRAVENOUS

## 2015-10-18 MED ORDER — LACTATED RINGERS IV SOLN
INTRAVENOUS | Status: DC
  Administered 2015-10-18 (×2): via INTRAVENOUS

## 2015-10-18 MED ORDER — GLYCOPYRROLATE 0.2 MG/ML IJ SOLN
INTRAMUSCULAR | Status: DC | PRN
  Administered 2015-10-18: 0.1 mg via INTRAVENOUS

## 2015-10-18 MED ORDER — PROPOFOL 10 MG/ML IV BOLUS
INTRAVENOUS | Status: DC | PRN
  Administered 2015-10-18: 100 mg via INTRAVENOUS
  Administered 2015-10-18 (×2): 50 mg via INTRAVENOUS

## 2015-10-18 MED ORDER — ACETAMINOPHEN 325 MG PO TABS
325.0000 mg | ORAL_TABLET | ORAL | Status: DC | PRN
  Administered 2015-10-18: 650 mg via ORAL

## 2015-10-18 SURGICAL SUPPLY — 31 items
BALLN DILATOR 10-12 8 (BALLOONS)
BALLN DILATOR 12-15 8 (BALLOONS)
BALLN DILATOR 15-18 8 (BALLOONS)
BALLN DILATOR CRE 0-12 8 (BALLOONS)
BALLN DILATOR ESOPH 8 10 CRE (MISCELLANEOUS) IMPLANT
BALLOON DILATOR 12-15 8 (BALLOONS) IMPLANT
BALLOON DILATOR 15-18 8 (BALLOONS) IMPLANT
BALLOON DILATOR CRE 0-12 8 (BALLOONS) IMPLANT
BLOCK BITE 60FR ADLT L/F GRN (MISCELLANEOUS) ×3 IMPLANT
CANISTER SUCT 1200ML W/VALVE (MISCELLANEOUS) ×3 IMPLANT
CLIP HMST 235XBRD CATH ROT (MISCELLANEOUS) IMPLANT
CLIP RESOLUTION 360 11X235 (MISCELLANEOUS)
FCP ESCP3.2XJMB 240X2.8X (MISCELLANEOUS)
FORCEPS BIOP RAD 4 LRG CAP 4 (CUTTING FORCEPS) ×3 IMPLANT
FORCEPS BIOP RJ4 240 W/NDL (MISCELLANEOUS)
FORCEPS ESCP3.2XJMB 240X2.8X (MISCELLANEOUS) IMPLANT
GOWN CVR UNV OPN BCK APRN NK (MISCELLANEOUS) ×2 IMPLANT
GOWN ISOL THUMB LOOP REG UNIV (MISCELLANEOUS) ×4
INJECTOR VARIJECT VIN23 (MISCELLANEOUS) IMPLANT
KIT DEFENDO VALVE AND CONN (KITS) IMPLANT
KIT ENDO PROCEDURE OLY (KITS) ×3 IMPLANT
MARKER SPOT ENDO TATTOO 5ML (MISCELLANEOUS) IMPLANT
PAD GROUND ADULT SPLIT (MISCELLANEOUS) IMPLANT
SNARE SHORT THROW 13M SML OVAL (MISCELLANEOUS) IMPLANT
SNARE SHORT THROW 30M LRG OVAL (MISCELLANEOUS) IMPLANT
SPOT EX ENDOSCOPIC TATTOO (MISCELLANEOUS)
SYR INFLATION 60ML (SYRINGE) IMPLANT
TRAP ETRAP POLY (MISCELLANEOUS) IMPLANT
VARIJECT INJECTOR VIN23 (MISCELLANEOUS)
WATER STERILE IRR 250ML POUR (IV SOLUTION) ×3 IMPLANT
WIRE CRE 18-20MM 8CM F G (MISCELLANEOUS) IMPLANT
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
## 2015-10-18 NOTE — Anesthesia Preprocedure Evaluation (Signed)
Anesthesia Evaluation  Patient identified by MRN, date of birth, ID band Patient awake    Reviewed: Allergy & Precautions, NPO status , Patient's Chart, lab work & pertinent test results  Airway Mallampati: II  TM Distance: >3 FB Neck ROM: full    Dental   Pulmonary Current Smoker,    breath sounds clear to auscultation       Cardiovascular Normal cardiovascular exam     Neuro/Psych PSYCHIATRIC DISORDERS Anxiety Depression    GI/Hepatic   Endo/Other    Renal/GU      Musculoskeletal   Abdominal   Peds  Hematology   Anesthesia Other Findings   Reproductive/Obstetrics                             Anesthesia Physical Anesthesia Plan  ASA: II  Anesthesia Plan: MAC   Post-op Pain Management:    Induction:   Airway Management Planned:   Additional Equipment:   Intra-op Plan:   Post-operative Plan:   Informed Consent: I have reviewed the patients History and Physical, chart, labs and discussed the procedure including the risks, benefits and alternatives for the proposed anesthesia with the patient or authorized representative who has indicated his/her understanding and acceptance.     Plan Discussed with: CRNA  Anesthesia Plan Comments:         Anesthesia Quick Evaluation

## 2015-10-18 NOTE — Op Note (Signed)
[facility] Patient Name: Kara Harrell Procedure Date: 10/18/2015 9:45 AM MRN: [id number] Account #: [id number] Date of Birth: 05-20-1971 Admit Type: Outpatient Age: 45 Room: [facility] OR ROOM 01 Gender: Female Note Status: Finalized Procedure:            Upper GI endoscopy Indications:          Dysphagia Providers:            Lucilla Lame, MD Referring MD:         Bethena Roys. Sowles, MD (Referring MD) Medicines:            Propofol per Anesthesia Complications:        No immediate complications. Procedure:            Pre-Anesthesia Assessment:                       - Prior to the procedure, a History and Physical was                        performed, and patient medications and allergies were                        reviewed. The patient's tolerance of previous                        anesthesia was also reviewed. The risks and benefits of                        the procedure and the sedation options and risks were                        discussed with the patient. All questions were                        answered, and informed consent was obtained. Prior                        Anticoagulants: The patient has taken no previous                        anticoagulant or antiplatelet agents. ASA Grade                        Assessment: II - A patient with mild systemic disease.                        After reviewing the risks and benefits, the patient was                        deemed in satisfactory condition to undergo the                        procedure.                       After obtaining informed consent, the endoscope was                        passed under direct vision. Throughout the procedure,  the patient's blood pressure, pulse, and oxygen                        saturations were monitored continuously. The Olympus                        [id number]-HQ190 Endoscope (S#. [id number]) was introduced                        through the  mouth, and advanced to the second part of                        duodenum. The upper GI endoscopy was accomplished                        without difficulty. The patient tolerated the procedure                        well. Findings:      One mild benign-appearing, intrinsic stenosis was found in the upper       third of the esophagus. And was traversed. The scope was withdrawn.       Dilation was performed with a Maloney dilator with mild resistance at 20       Fr. The dilation site was examined following endoscope reinsertion and       showed complete resolution of luminal narrowing.      Localized moderate inflammation characterized by erosions was found in       the gastric antrum. Biopsies were taken with a cold forceps for       histology.      The examined duodenum was normal. Impression:           - Benign-appearing esophageal stenosis. Dilated.                       - Gastritis. Biopsied.                       - Normal examined duodenum. Recommendation:       - Repeat upper endoscopy PRN for retreatment. Procedure Code(s):    --- Professional ---                       [id number], Esophagogastroduodenoscopy, flexible, transoral;                        with biopsy, single or multiple                       43450, Dilation of esophagus, by unguided sound or                        bougie, single or multiple passes Diagnosis Code(s):    --- Professional ---                       R13.10, Dysphagia, unspecified                       K22.2, Esophageal obstruction                       K29.70, Gastritis, unspecified, without bleeding CPT copyright 2016 American  Medical Association. All rights reserved. The codes documented in this report are preliminary and upon coder review may  be revised to meet current compliance requirements. Lucilla Lame, MD 10/18/2015 10:07:00 AM This report has been signed electronically. Number of Addenda: 0 Note Initiated On: 10/18/2015 9:45 AM      [facility]

## 2015-10-18 NOTE — Transfer of Care (Signed)
Immediate Anesthesia Transfer of Care Note  Patient: Kara Harrell  Procedure(s) Performed: Procedure(s): ESOPHAGOGASTRODUODENOSCOPY (EGD) WITH PROPOFOL with dialation (N/A)  Patient Location: PACU  Anesthesia Type: MAC  Level of Consciousness: awake, alert  and patient cooperative  Airway and Oxygen Therapy: Patient Spontanous Breathing and Patient connected to supplemental oxygen  Post-op Assessment: Post-op Vital signs reviewed, Patient's Cardiovascular Status Stable, Respiratory Function Stable, Patent Airway and No signs of Nausea or vomiting  Post-op Vital Signs: Reviewed and stable  Complications: No apparent anesthesia complications

## 2015-10-18 NOTE — Anesthesia Postprocedure Evaluation (Signed)
Anesthesia Post Note  Patient: Kara Harrell  Procedure(s) Performed: Procedure(s) (LRB): ESOPHAGOGASTRODUODENOSCOPY (EGD) WITH PROPOFOL with dialation (N/A)  Patient location during evaluation: PACU Anesthesia Type: MAC Level of consciousness: awake and alert Pain management: pain level controlled Vital Signs Assessment: post-procedure vital signs reviewed and stable Respiratory status: spontaneous breathing, nonlabored ventilation, respiratory function stable and patient connected to nasal cannula oxygen Cardiovascular status: stable and blood pressure returned to baseline Anesthetic complications: no    Amaryllis Dyke

## 2015-10-18 NOTE — Anesthesia Procedure Notes (Signed)
Procedure Name: MAC Performed by: Kashmere Staffa Pre-anesthesia Checklist: Patient identified, Emergency Drugs available, Suction available, Patient being monitored and Timeout performed Patient Re-evaluated:Patient Re-evaluated prior to inductionOxygen Delivery Method: Nasal cannula       

## 2015-10-19 ENCOUNTER — Encounter: Payer: Self-pay | Admitting: Gastroenterology

## 2015-10-20 ENCOUNTER — Encounter: Payer: Self-pay | Admitting: Gastroenterology

## 2015-11-13 ENCOUNTER — Other Ambulatory Visit: Payer: Self-pay | Admitting: Family Medicine

## 2015-11-15 NOTE — Telephone Encounter (Signed)
Patient requesting refill. 

## 2015-11-16 ENCOUNTER — Ambulatory Visit: Payer: 59 | Admitting: Family Medicine

## 2015-11-16 ENCOUNTER — Encounter: Payer: Self-pay | Admitting: Family Medicine

## 2015-11-16 VITALS — BP 108/66 | HR 94 | Temp 98.5°F | Resp 16 | Wt 108.4 lb

## 2015-11-16 DIAGNOSIS — F32A Depression, unspecified: Secondary | ICD-10-CM

## 2015-11-16 DIAGNOSIS — F329 Major depressive disorder, single episode, unspecified: Secondary | ICD-10-CM

## 2015-11-17 NOTE — Progress Notes (Signed)
Due to physician emergency. Patient was not seen

## 2015-12-17 ENCOUNTER — Other Ambulatory Visit: Payer: Self-pay | Admitting: Family Medicine

## 2016-01-12 ENCOUNTER — Other Ambulatory Visit: Payer: Self-pay | Admitting: Family Medicine

## 2016-01-12 DIAGNOSIS — L509 Urticaria, unspecified: Secondary | ICD-10-CM

## 2016-02-12 ENCOUNTER — Other Ambulatory Visit: Payer: Self-pay | Admitting: Family Medicine

## 2016-02-16 ENCOUNTER — Encounter: Payer: Self-pay | Admitting: Family Medicine

## 2016-02-16 ENCOUNTER — Ambulatory Visit (INDEPENDENT_AMBULATORY_CARE_PROVIDER_SITE_OTHER): Payer: 59 | Admitting: Family Medicine

## 2016-02-16 VITALS — BP 116/70 | HR 83 | Temp 98.1°F | Resp 16 | Ht 63.0 in | Wt 110.4 lb

## 2016-02-16 DIAGNOSIS — L509 Urticaria, unspecified: Secondary | ICD-10-CM

## 2016-02-16 DIAGNOSIS — K2970 Gastritis, unspecified, without bleeding: Secondary | ICD-10-CM

## 2016-02-16 DIAGNOSIS — Z9889 Other specified postprocedural states: Secondary | ICD-10-CM | POA: Diagnosis not present

## 2016-02-16 DIAGNOSIS — L299 Pruritus, unspecified: Secondary | ICD-10-CM | POA: Diagnosis not present

## 2016-02-16 DIAGNOSIS — F321 Major depressive disorder, single episode, moderate: Secondary | ICD-10-CM

## 2016-02-16 MED ORDER — BUPROPION HCL ER (XL) 150 MG PO TB24: 150.0000 mg | ORAL_TABLET | Freq: Every day | ORAL | 2 refills | Status: DC
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
## 2016-04-13 ENCOUNTER — Other Ambulatory Visit: Payer: Self-pay | Admitting: Family Medicine

## 2016-04-13 DIAGNOSIS — L509 Urticaria, unspecified: Secondary | ICD-10-CM

## 2016-04-13 NOTE — Telephone Encounter (Signed)
Patient requesting refill of Hydroxyzine to [facility].

## 2016-05-08 ENCOUNTER — Ambulatory Visit: Payer: 59 | Admitting: Family Medicine

## 2016-05-27 ENCOUNTER — Other Ambulatory Visit: Payer: Self-pay | Admitting: Family Medicine

## 2016-05-27 DIAGNOSIS — L509 Urticaria, unspecified: Secondary | ICD-10-CM

## 2016-06-02 ENCOUNTER — Other Ambulatory Visit: Payer: Self-pay | Admitting: Family Medicine

## 2016-06-02 ENCOUNTER — Telehealth: Payer: Self-pay | Admitting: Family Medicine

## 2016-06-02 MED ORDER — CLONAZEPAM 0.5 MG PO TABS: ORAL_TABLET | ORAL | 0 refills | Status: DC

## 2016-06-02 NOTE — Telephone Encounter (Signed)
Please call in 30 days

## 2016-06-02 NOTE — Telephone Encounter (Signed)
Pt needs refill on Klonopin. Pt has an appt scheduled for 06/27/2016.

## 2016-06-02 NOTE — Telephone Encounter (Signed)
Called Klonopin prescription into [facility].

## 2016-06-16 ENCOUNTER — Other Ambulatory Visit: Payer: Self-pay | Admitting: Family Medicine

## 2016-06-16 DIAGNOSIS — F321 Major depressive disorder, single episode, moderate: Secondary | ICD-10-CM

## 2016-06-27 ENCOUNTER — Encounter: Payer: Self-pay | Admitting: Family Medicine

## 2016-06-27 ENCOUNTER — Ambulatory Visit (INDEPENDENT_AMBULATORY_CARE_PROVIDER_SITE_OTHER): Payer: 59 | Admitting: Family Medicine

## 2016-06-27 VITALS — BP 118/82 | HR 114 | Temp 98.7°F | Resp 18 | Ht 62.0 in | Wt 114.1 lb

## 2016-06-27 DIAGNOSIS — F4320 Adjustment disorder, unspecified: Secondary | ICD-10-CM | POA: Diagnosis not present

## 2016-06-27 DIAGNOSIS — Z23 Encounter for immunization: Secondary | ICD-10-CM

## 2016-06-27 DIAGNOSIS — L509 Urticaria, unspecified: Secondary | ICD-10-CM

## 2016-06-27 DIAGNOSIS — F410 Panic disorder [episodic paroxysmal anxiety] without agoraphobia: Secondary | ICD-10-CM

## 2016-06-27 DIAGNOSIS — F321 Major depressive disorder, single episode, moderate: Secondary | ICD-10-CM | POA: Diagnosis not present

## 2016-06-27 DIAGNOSIS — F4321 Adjustment disorder with depressed mood: Secondary | ICD-10-CM

## 2016-06-27 MED ORDER — ESCITALOPRAM OXALATE 20 MG PO TABS: 20.0000 mg | ORAL_TABLET | Freq: Every day | ORAL | 1 refills | Status: DC

## 2016-06-27 MED ORDER — HYDROXYZINE HCL 25 MG PO TABS: 25.0000 mg | ORAL_TABLET | Freq: Every evening | ORAL | 2 refills | Status: DC | PRN

## 2016-06-27 MED ORDER — LEVOCETIRIZINE DIHYDROCHLORIDE 5 MG PO TABS: 5.0000 mg | ORAL_TABLET | Freq: Every evening | ORAL | 2 refills | Status: DC

## 2016-06-27 MED ORDER — CLONAZEPAM 0.5 MG PO TABS: ORAL_TABLET | ORAL | 0 refills | Status: DC
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
## 2016-08-25 ENCOUNTER — Other Ambulatory Visit: Payer: Self-pay

## 2016-08-25 DIAGNOSIS — L509 Urticaria, unspecified: Secondary | ICD-10-CM

## 2016-08-25 MED ORDER — HYDROXYZINE HCL 25 MG PO TABS: 25.0000 mg | ORAL_TABLET | Freq: Every evening | ORAL | 0 refills | Status: DC | PRN

## 2016-09-25 ENCOUNTER — Ambulatory Visit: Payer: 59 | Admitting: Family Medicine

## 2016-11-13 ENCOUNTER — Other Ambulatory Visit: Payer: Self-pay | Admitting: Family Medicine

## 2016-11-13 DIAGNOSIS — L509 Urticaria, unspecified: Secondary | ICD-10-CM

## 2016-11-13 NOTE — Telephone Encounter (Signed)
Patient requesting refill of Hydroxyzine to [facility].

## 2016-11-14 ENCOUNTER — Ambulatory Visit: Payer: 59 | Admitting: Family Medicine

## 2016-11-20 ENCOUNTER — Other Ambulatory Visit: Payer: Self-pay

## 2016-11-20 DIAGNOSIS — L509 Urticaria, unspecified: Secondary | ICD-10-CM

## 2016-11-20 NOTE — Telephone Encounter (Signed)
Patient requesting refill of Hydroxyzine to [facility].

## 2016-11-21 NOTE — Telephone Encounter (Signed)
Left message to notify patient.

## 2016-11-24 ENCOUNTER — Encounter: Payer: Self-pay | Admitting: Family Medicine

## 2016-11-24 ENCOUNTER — Ambulatory Visit (INDEPENDENT_AMBULATORY_CARE_PROVIDER_SITE_OTHER): Payer: 59 | Admitting: Family Medicine

## 2016-11-24 VITALS — BP 102/66 | HR 92 | Temp 98.2°F | Resp 16 | Wt 117.9 lb

## 2016-11-24 DIAGNOSIS — F4320 Adjustment disorder, unspecified: Secondary | ICD-10-CM | POA: Diagnosis not present

## 2016-11-24 DIAGNOSIS — J40 Bronchitis, not specified as acute or chronic: Secondary | ICD-10-CM | POA: Diagnosis not present

## 2016-11-24 DIAGNOSIS — L509 Urticaria, unspecified: Secondary | ICD-10-CM

## 2016-11-24 DIAGNOSIS — H9201 Otalgia, right ear: Secondary | ICD-10-CM

## 2016-11-24 DIAGNOSIS — F321 Major depressive disorder, single episode, moderate: Secondary | ICD-10-CM | POA: Diagnosis not present

## 2016-11-24 DIAGNOSIS — F410 Panic disorder [episodic paroxysmal anxiety] without agoraphobia: Secondary | ICD-10-CM

## 2016-11-24 DIAGNOSIS — J029 Acute pharyngitis, unspecified: Secondary | ICD-10-CM

## 2016-11-24 DIAGNOSIS — F4321 Adjustment disorder with depressed mood: Secondary | ICD-10-CM

## 2016-11-24 LAB — POCT RAPID STREP A (OFFICE): Rapid Strep A Screen: NEGATIVE

## 2016-11-24 MED ORDER — ESCITALOPRAM OXALATE 20 MG PO TABS: 20.0000 mg | ORAL_TABLET | Freq: Every day | ORAL | 1 refills | Status: DC

## 2016-11-24 MED ORDER — HYDROXYZINE HCL 25 MG PO TABS: 25.0000 mg | ORAL_TABLET | Freq: Every evening | ORAL | 0 refills | Status: DC | PRN

## 2016-11-24 MED ORDER — FLUTICASONE FUROATE-VILANTEROL 100-25 MCG/INH IN AEPB: 1.0000 | INHALATION_SPRAY | Freq: Every day | RESPIRATORY_TRACT | 0 refills | Status: DC

## 2016-11-24 MED ORDER — BENZONATATE 100 MG PO CAPS: 100.0000 mg | ORAL_CAPSULE | Freq: Three times a day (TID) | ORAL | 0 refills | Status: DC | PRN

## 2016-11-24 MED ORDER — BUPROPION HCL ER (XL) 150 MG PO TB24: 150.0000 mg | ORAL_TABLET | Freq: Every day | ORAL | 0 refills | Status: DC

## 2016-11-24 MED ORDER — CLONAZEPAM 0.5 MG PO TABS: ORAL_TABLET | ORAL | 0 refills | Status: DC

## 2016-11-24 MED ORDER — FLUTICASONE PROPIONATE 50 MCG/ACT NA SUSP: 2.0000 | Freq: Every day | NASAL | 0 refills | Status: DC

## 2016-11-24 MED ORDER — AZITHROMYCIN 250 MG PO TABS: ORAL_TABLET | ORAL | 0 refills | Status: DC

## 2016-11-24 NOTE — Addendum Note (Signed)
Addended by: Inda Coke on: 11/24/2016 03:45 PM   Modules accepted: Orders
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
## 2016-12-20 ENCOUNTER — Other Ambulatory Visit: Payer: Self-pay | Admitting: Family Medicine

## 2016-12-20 DIAGNOSIS — L509 Urticaria, unspecified: Secondary | ICD-10-CM

## 2016-12-25 ENCOUNTER — Ambulatory Visit: Payer: 59 | Admitting: Family Medicine

## 2016-12-27 ENCOUNTER — Ambulatory Visit (INDEPENDENT_AMBULATORY_CARE_PROVIDER_SITE_OTHER): Payer: 59 | Admitting: Family Medicine

## 2016-12-27 ENCOUNTER — Encounter: Payer: Self-pay | Admitting: Family Medicine

## 2016-12-27 VITALS — BP 100/64 | HR 108 | Temp 98.0°F | Resp 16 | Ht 62.0 in | Wt 117.4 lb

## 2016-12-27 DIAGNOSIS — Z131 Encounter for screening for diabetes mellitus: Secondary | ICD-10-CM

## 2016-12-27 DIAGNOSIS — L509 Urticaria, unspecified: Secondary | ICD-10-CM

## 2016-12-27 DIAGNOSIS — E781 Pure hyperglyceridemia: Secondary | ICD-10-CM | POA: Diagnosis not present

## 2016-12-27 DIAGNOSIS — F321 Major depressive disorder, single episode, moderate: Secondary | ICD-10-CM

## 2016-12-27 DIAGNOSIS — F419 Anxiety disorder, unspecified: Secondary | ICD-10-CM

## 2016-12-27 MED ORDER — HYDROXYZINE HCL 25 MG PO TABS: 12.5000 mg | ORAL_TABLET | Freq: Two times a day (BID) | ORAL | 0 refills | Status: DC | PRN

## 2016-12-27 MED ORDER — BUPROPION HCL ER (XL) 150 MG PO TB24: 150.0000 mg | ORAL_TABLET | Freq: Every day | ORAL | 1 refills | Status: DC
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
## 2017-01-22 ENCOUNTER — Other Ambulatory Visit: Payer: Self-pay | Admitting: Family Medicine

## 2017-01-22 DIAGNOSIS — L509 Urticaria, unspecified: Secondary | ICD-10-CM

## 2017-01-27 ENCOUNTER — Other Ambulatory Visit: Payer: Self-pay | Admitting: Family Medicine

## 2017-01-27 DIAGNOSIS — L509 Urticaria, unspecified: Secondary | ICD-10-CM

## 2017-01-31 ENCOUNTER — Other Ambulatory Visit: Payer: Self-pay

## 2017-01-31 DIAGNOSIS — F419 Anxiety disorder, unspecified: Secondary | ICD-10-CM

## 2017-01-31 DIAGNOSIS — L509 Urticaria, unspecified: Secondary | ICD-10-CM

## 2017-01-31 NOTE — Telephone Encounter (Signed)
Patient requesting refill of Hydroxyzine to [facility].

## 2017-02-01 ENCOUNTER — Other Ambulatory Visit: Payer: Self-pay | Admitting: Family Medicine

## 2017-02-01 NOTE — Telephone Encounter (Signed)
No. She was given 180 one month ago to last 3 months

## 2017-02-02 IMAGING — US US EXTREM LOW VENOUS BILAT
1 series · 13 of 24 positions shown · non-contrast
Comparison: None.

CLINICAL DATA: Bilateral leg pain and swelling for 1 day.



[Series 1: us extrem low venous bilat · 0.05mm/px · 13 of 60 slices shown]
[im 1/60]
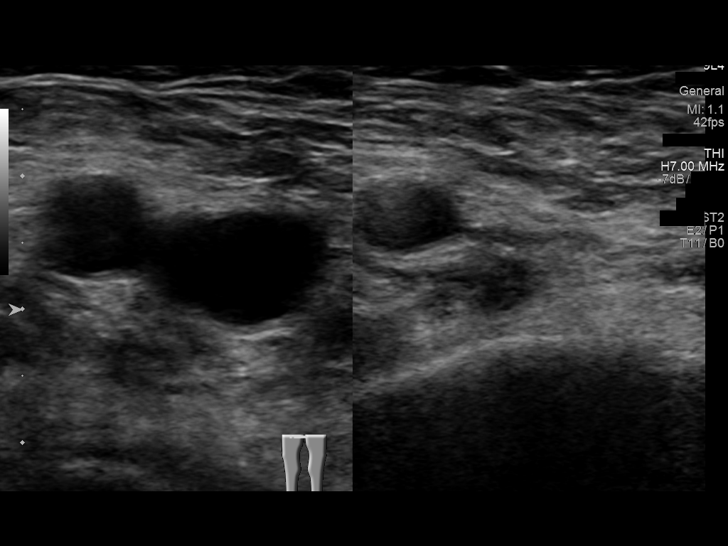
[im 6/60]
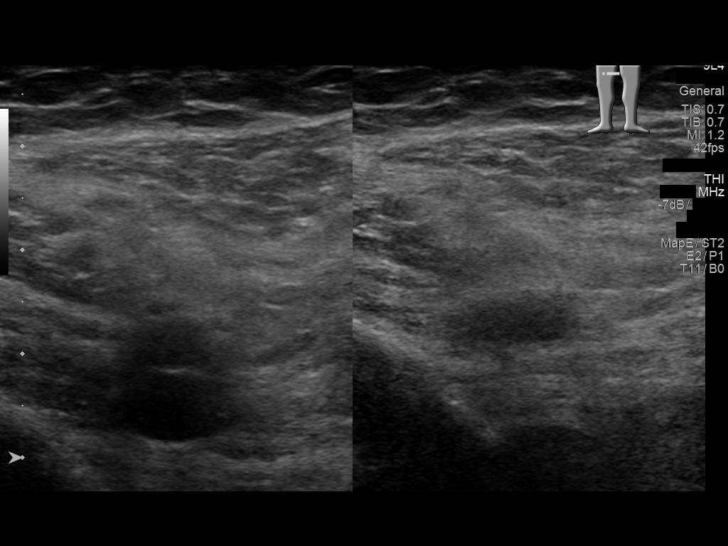
[im 11/60]
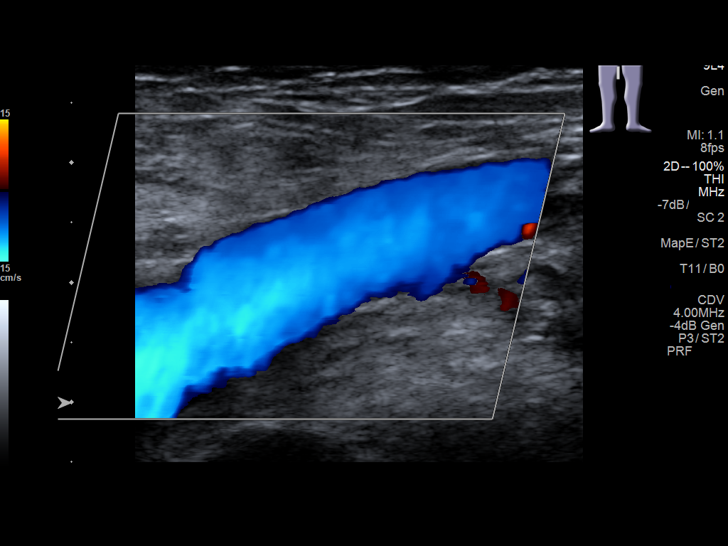
[im 16/60]
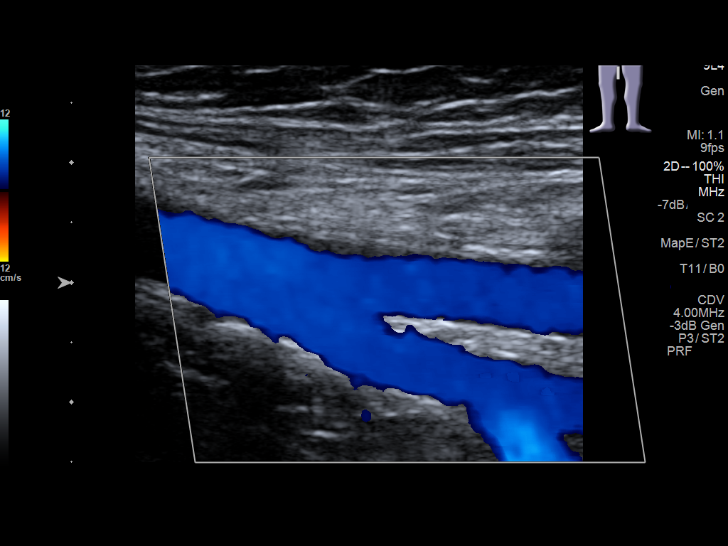
[im 21/60]
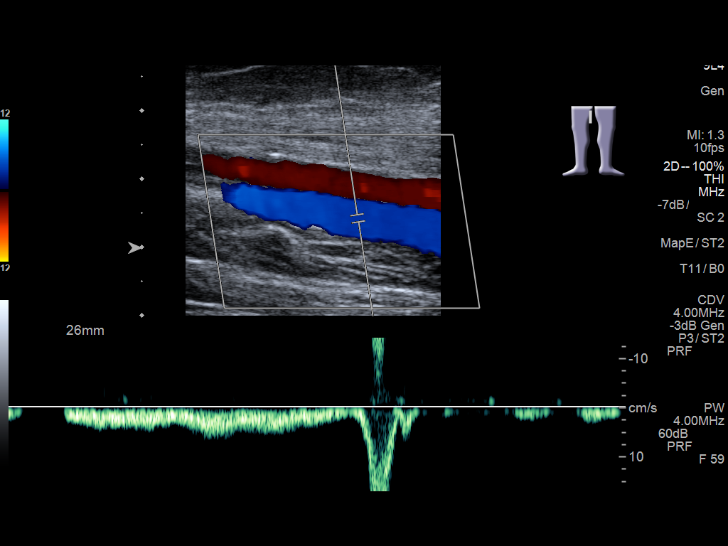
[im 26/60]
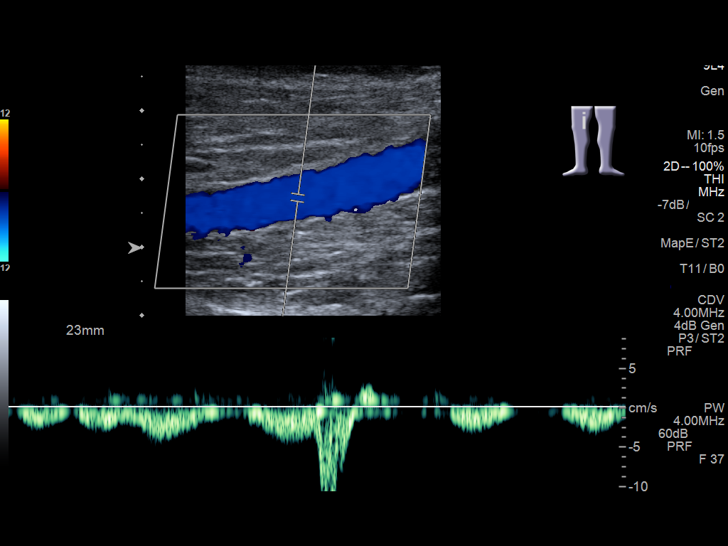
[im 31/60]
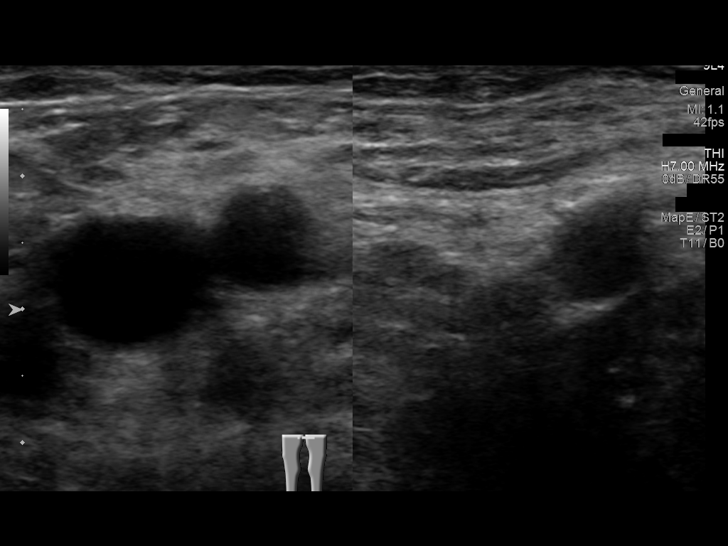
[im 34/60]
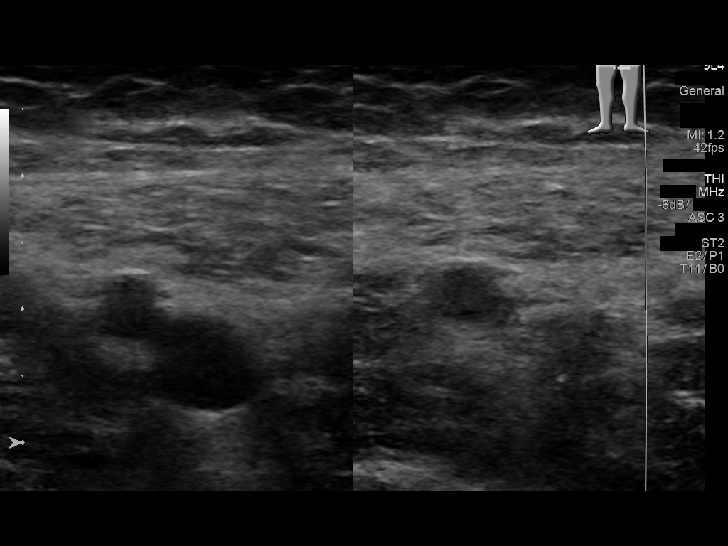
[im 39/60]
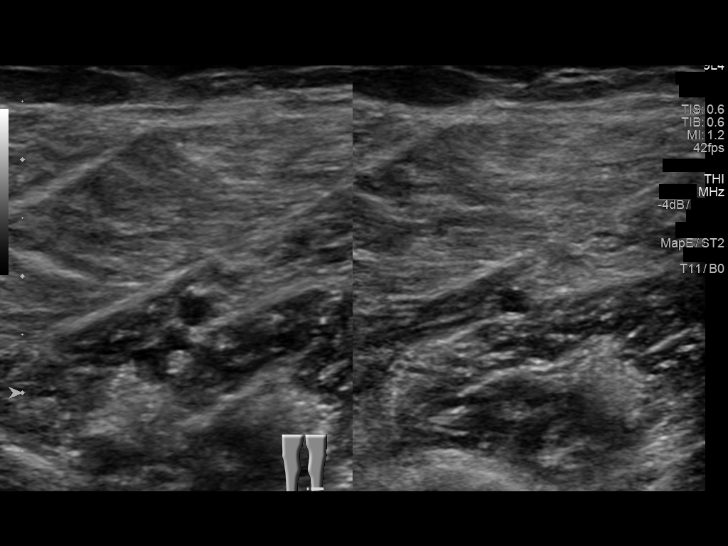
[im 44/60]
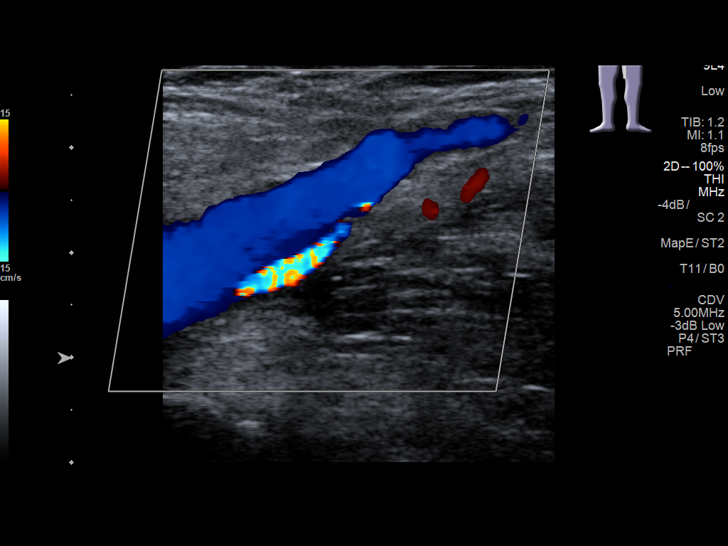
[im 49/60]
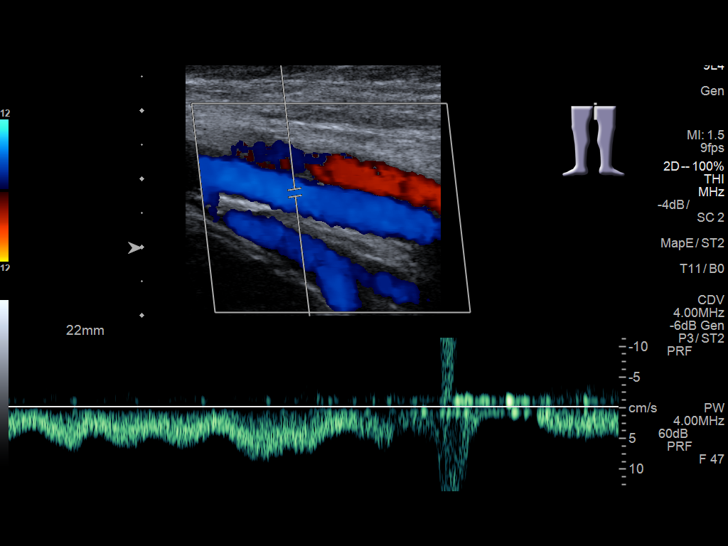
[im 54/60]
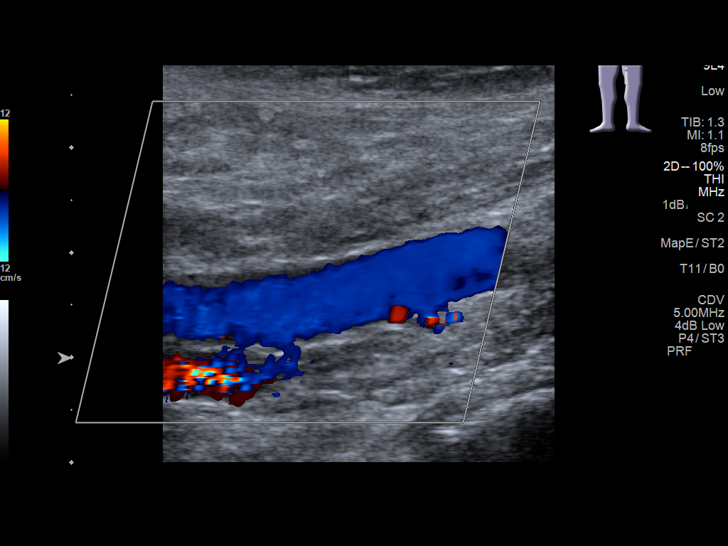
[im 60/60]
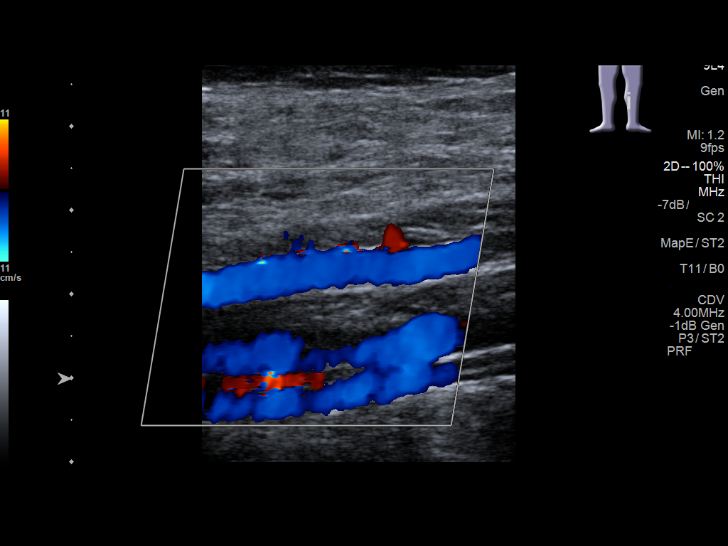

[13 of 24 positions shown; findings below may reference images not displayed]

FINDINGS: RIGHT LOWER EXTREMITY

Common Femoral Vein: No evidence of thrombus. Normal
compressibility, respiratory phasicity and response to augmentation.

Saphenofemoral Junction: No evidence of thrombus. Normal
compressibility and flow on color Doppler imaging.

Profunda Femoral Vein: No evidence of thrombus. Normal
compressibility and flow on color Doppler imaging.

Femoral Vein: No evidence of thrombus. Normal compressibility,
respiratory phasicity and response to augmentation.

Popliteal Vein: No evidence of thrombus. Normal compressibility,
respiratory phasicity and response to augmentation.

Calf Veins: No evidence of thrombus. Normal compressibility and flow
on color Doppler imaging.

Superficial Great Saphenous Vein: No evidence of thrombus. Normal
compressibility and flow on color Doppler imaging.

Venous Reflux:  None.

Other Findings:  None.

LEFT LOWER EXTREMITY

Common Femoral Vein: No evidence of thrombus. Normal
compressibility, respiratory phasicity and response to augmentation.

Saphenofemoral Junction: No evidence of thrombus. Normal
compressibility and flow on color Doppler imaging.

Profunda Femoral Vein: No evidence of thrombus. Normal
compressibility and flow on color Doppler imaging.

Femoral Vein: No evidence of thrombus. Normal compressibility,
respiratory phasicity and response to augmentation.

Popliteal Vein: No evidence of thrombus. Normal compressibility,
respiratory phasicity and response to augmentation.

Calf Veins: No evidence of thrombus. Normal compressibility and flow
on color Doppler imaging.

Superficial Great Saphenous Vein: No evidence of thrombus. Normal
compressibility and flow on color Doppler imaging.

Venous Reflux:  None.

Other Findings:  None.
IMPRESSION: No evidence of bilateral lower extremity deep venous thrombosis.

## 2017-02-07 ENCOUNTER — Other Ambulatory Visit: Payer: Self-pay | Admitting: Family Medicine

## 2017-02-07 DIAGNOSIS — L509 Urticaria, unspecified: Secondary | ICD-10-CM

## 2017-03-30 ENCOUNTER — Ambulatory Visit: Payer: 59 | Admitting: Family Medicine

## 2017-04-05 ENCOUNTER — Ambulatory Visit (INDEPENDENT_AMBULATORY_CARE_PROVIDER_SITE_OTHER): Payer: 59 | Admitting: Family Medicine

## 2017-04-05 ENCOUNTER — Encounter: Payer: Self-pay | Admitting: Family Medicine

## 2017-04-05 VITALS — BP 122/68 | HR 87 | Temp 98.2°F | Resp 16 | Ht 62.0 in | Wt 121.0 lb

## 2017-04-05 DIAGNOSIS — E781 Pure hyperglyceridemia: Secondary | ICD-10-CM | POA: Diagnosis not present

## 2017-04-05 DIAGNOSIS — L509 Urticaria, unspecified: Secondary | ICD-10-CM

## 2017-04-05 DIAGNOSIS — F321 Major depressive disorder, single episode, moderate: Secondary | ICD-10-CM | POA: Diagnosis not present

## 2017-04-05 DIAGNOSIS — N6311 Unspecified lump in the right breast, upper outer quadrant: Secondary | ICD-10-CM

## 2017-04-05 DIAGNOSIS — F410 Panic disorder [episodic paroxysmal anxiety] without agoraphobia: Secondary | ICD-10-CM | POA: Diagnosis not present

## 2017-04-05 DIAGNOSIS — Z23 Encounter for immunization: Secondary | ICD-10-CM | POA: Diagnosis not present

## 2017-04-05 DIAGNOSIS — N644 Mastodynia: Secondary | ICD-10-CM

## 2017-04-05 MED ORDER — CLONAZEPAM 0.5 MG PO TABS: ORAL_TABLET | ORAL | 0 refills | Status: DC

## 2017-04-05 MED ORDER — ESCITALOPRAM OXALATE 20 MG PO TABS: 20.0000 mg | ORAL_TABLET | Freq: Every day | ORAL | 1 refills | Status: DC

## 2017-04-05 MED ORDER — HYDROXYZINE HCL 25 MG PO TABS: 25.0000 mg | ORAL_TABLET | Freq: Every evening | ORAL | 0 refills | Status: DC | PRN
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
## 2017-04-06 LAB — HEMOGLOBIN A1C
EAG (MMOL/L): 5.4 (calc)
HEMOGLOBIN A1C: 5 %{Hb} (ref <5.7)
MEAN PLASMA GLUCOSE: 97 (calc)

## 2017-04-06 LAB — COMPLETE METABOLIC PANEL WITH GFR
AG Ratio: 1.6 (calc) (ref 1.0–2.5)
ALBUMIN MSPROF: 4.1 g/dL (ref 3.6–5.1)
ALT: 15 U/L (ref 6–29)
AST: 19 U/L (ref 10–35)
Alkaline phosphatase (APISO): 74 U/L (ref 33–115)
BILIRUBIN TOTAL: 0.5 mg/dL (ref 0.2–1.2)
BUN: 15 mg/dL (ref 7–25)
CALCIUM: 9 mg/dL (ref 8.6–10.2)
CHLORIDE: 103 mmol/L (ref 98–110)
CO2: 25 mmol/L (ref 20–32)
CREATININE: 0.7 mg/dL (ref 0.50–1.10)
GFR, EST AFRICAN AMERICAN: 119 mL/min/{1.73_m2} (ref >=60)
GFR, EST NON AFRICAN AMERICAN: 102 mL/min/{1.73_m2} (ref >=60)
Globulin: 2.5 g/dL (calc) (ref 1.9–3.7)
Glucose, Bld: 84 mg/dL (ref 65–99)
Potassium: 4.3 mmol/L (ref 3.5–5.3)
Sodium: 136 mmol/L (ref 135–146)
TOTAL PROTEIN: 6.6 g/dL (ref 6.1–8.1)

## 2017-04-06 LAB — CBC WITH DIFFERENTIAL/PLATELET
BASOS PCT: 0.3 %
Basophils Absolute: 19 cells/uL (ref 0–200)
EOS ABS: 77 {cells}/uL (ref 15–500)
Eosinophils Relative: 1.2 %
HCT: 37.6 % (ref 35.0–45.0)
HEMOGLOBIN: 13.1 g/dL (ref 11.7–15.5)
Lymphs Abs: 1402 cells/uL (ref 850–3900)
MCH: 31 pg (ref 27.0–33.0)
MCHC: 34.8 g/dL (ref 32.0–36.0)
MCV: 88.9 fL (ref 80.0–100.0)
MONOS PCT: 5.7 %
MPV: 10.1 fL (ref 7.5–12.5)
NEUTROS ABS: 4538 {cells}/uL (ref 1500–7800)
Neutrophils Relative %: 70.9 %
Platelets: 246 10*3/uL (ref 140–400)
RBC: 4.23 10*6/uL (ref 3.80–5.10)
RDW: 12.1 % (ref 11.0–15.0)
Total Lymphocyte: 21.9 %
WBC: 6.4 10*3/uL (ref 3.8–10.8)
WBCMIX: 365 {cells}/uL (ref 200–950)

## 2017-04-06 LAB — LIPID PANEL
CHOL/HDL RATIO: 2.6 (calc) (ref <5.0)
Cholesterol: 221 mg/dL — ABNORMAL HIGH (ref <200)
HDL: 84 mg/dL (ref >50)
LDL CHOLESTEROL (CALC): 109 mg/dL — AB
NON-HDL CHOLESTEROL (CALC): 137 mg/dL — AB (ref <130)
Triglycerides: 158 mg/dL — ABNORMAL HIGH (ref <150)

## 2017-04-06 LAB — TSH: TSH: 1.42 m[IU]/L

## 2017-04-17 ENCOUNTER — Other Ambulatory Visit: Payer: Self-pay | Admitting: *Deleted

## 2017-04-17 ENCOUNTER — Inpatient Hospital Stay
Admission: RE | Admit: 2017-04-17 | Discharge: 2017-04-17 | Disposition: A | Discharge status: Home / Self Care | Payer: Self-pay | Source: Ambulatory Visit | Attending: *Deleted | Admitting: *Deleted

## 2017-04-17 DIAGNOSIS — Z9289 Personal history of other medical treatment: Secondary | ICD-10-CM

## 2017-04-24 NOTE — Addendum Note (Signed)
Addended by: Inda Coke on: 04/24/2017 08:47 AM   Modules accepted: Orders

## 2017-05-07 ENCOUNTER — Other Ambulatory Visit: Payer: Self-pay | Admitting: Family Medicine

## 2017-05-07 DIAGNOSIS — F419 Anxiety disorder, unspecified: Secondary | ICD-10-CM

## 2017-05-07 DIAGNOSIS — L509 Urticaria, unspecified: Secondary | ICD-10-CM

## 2017-05-08 NOTE — Telephone Encounter (Signed)
Refill request for general medication: Hydroxyzine 25 mg  Last office visit: 04/05/2017  Last physical exam: 10/04/2015  Follow up visit: 07/11/2017

## 2017-05-11 ENCOUNTER — Ambulatory Visit
Admission: RE | Admit: 2017-05-11 | Discharge: 2017-05-11 | Disposition: A | Discharge status: Home / Self Care | Payer: 59 | Source: Ambulatory Visit | Attending: Family Medicine | Admitting: Family Medicine

## 2017-05-11 DIAGNOSIS — N6311 Unspecified lump in the right breast, upper outer quadrant: Secondary | ICD-10-CM

## 2017-05-11 DIAGNOSIS — N644 Mastodynia: Secondary | ICD-10-CM

## 2017-06-20 ENCOUNTER — Other Ambulatory Visit: Payer: Self-pay

## 2017-06-20 DIAGNOSIS — F321 Major depressive disorder, single episode, moderate: Secondary | ICD-10-CM

## 2017-06-20 NOTE — Telephone Encounter (Signed)
Refill request for general medication: Wellbutrin XL 150 mg  Last office visit: 04/05/2017  Last physical exam: 10/04/2015  Follow-up on file. 07/11/2017

## 2017-06-21 MED ORDER — BUPROPION HCL ER (XL) 150 MG PO TB24: 150.0000 mg | ORAL_TABLET | Freq: Every day | ORAL | 0 refills | Status: DC

## 2017-07-11 ENCOUNTER — Ambulatory Visit: Payer: 59 | Admitting: Family Medicine

## 2017-08-08 ENCOUNTER — Other Ambulatory Visit: Payer: Self-pay

## 2017-08-08 DIAGNOSIS — F419 Anxiety disorder, unspecified: Secondary | ICD-10-CM

## 2017-08-08 DIAGNOSIS — L509 Urticaria, unspecified: Secondary | ICD-10-CM

## 2017-08-08 NOTE — Telephone Encounter (Signed)
Refill request for general medication. Hydroxyzine to [facility]  Last office visit: 04/05/2017   No Follow-up on file.

## 2017-08-09 NOTE — Telephone Encounter (Signed)
LVM to schedule an appt for med refill

## 2018-02-18 ENCOUNTER — Ambulatory Visit (INDEPENDENT_AMBULATORY_CARE_PROVIDER_SITE_OTHER): Payer: 59 | Admitting: Family Medicine

## 2018-02-18 ENCOUNTER — Encounter: Payer: Self-pay | Admitting: Family Medicine

## 2018-02-18 VITALS — BP 118/72 | HR 97 | Temp 98.1°F | Resp 14 | Ht 62.0 in | Wt 123.9 lb

## 2018-02-18 DIAGNOSIS — L509 Urticaria, unspecified: Secondary | ICD-10-CM

## 2018-02-18 DIAGNOSIS — N951 Menopausal and female climacteric states: Secondary | ICD-10-CM | POA: Diagnosis not present

## 2018-02-18 DIAGNOSIS — F419 Anxiety disorder, unspecified: Secondary | ICD-10-CM | POA: Diagnosis not present

## 2018-02-18 DIAGNOSIS — F321 Major depressive disorder, single episode, moderate: Secondary | ICD-10-CM | POA: Diagnosis not present

## 2018-02-18 MED ORDER — BUPROPION HCL ER (XL) 150 MG PO TB24: 150.0000 mg | ORAL_TABLET | Freq: Every day | ORAL | 1 refills | Status: DC

## 2018-02-18 MED ORDER — HYDROXYZINE HCL 25 MG PO TABS: 25.0000 mg | ORAL_TABLET | Freq: Two times a day (BID) | ORAL | 0 refills | Status: DC | PRN
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
## 2018-02-28 ENCOUNTER — Encounter: Payer: Self-pay | Admitting: Family Medicine

## 2018-02-28 ENCOUNTER — Ambulatory Visit (INDEPENDENT_AMBULATORY_CARE_PROVIDER_SITE_OTHER): Payer: 59 | Admitting: Family Medicine

## 2018-02-28 VITALS — BP 120/82 | HR 84 | Temp 98.3°F | Resp 14 | Ht 62.0 in | Wt 122.9 lb

## 2018-02-28 DIAGNOSIS — M25562 Pain in left knee: Secondary | ICD-10-CM | POA: Diagnosis not present

## 2018-02-28 DIAGNOSIS — M5442 Lumbago with sciatica, left side: Secondary | ICD-10-CM

## 2018-02-28 MED ORDER — MELOXICAM 15 MG PO TABS: 15.0000 mg | ORAL_TABLET | Freq: Every day | ORAL | 0 refills | Status: DC

## 2018-02-28 MED ORDER — PREDNISONE 5 MG (48) PO TBPK: ORAL_TABLET | ORAL | 0 refills | Status: DC

## 2018-02-28 NOTE — Progress Notes (Signed)
Name: Kara Harrell   MRN: [id number]    DOB: [birth year]   Date:02/28/2018       Progress Note  Subjective  Chief Complaint  Chief Complaint  Patient presents with  . Leg Pain    left leg pain. Pain worse at night and radiates to back    HPI  Pt presents with LEFT leg pain slowly worsening over the last month - with significant worsening in the last week.  She states the pain starts in the left knee and radiates into her bilateral lower back.  She notes the knee had some swelling intermittently.  Pain is described as a deep ache.   She tried ibuprofen and this did not help her pain until she took 1070m. Denies erythema, ecchymosis or recent injury, no LE weakness, numbness, or tingling.  She has been exercising more regularly over the last week. She notes the pain is worse at night when she is at rest. 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  Patient Active Problem List   Diagnosis Date Noted  . Stricture and stenosis of esophagus   . Grieving 06/01/2015  . Major depression ([place] 11/11/2014    Social History   Tobacco Use  . Smoking status: Former Smoker    Packs/day: 0.50    Years: 20.00    Pack years: 10.00    Types: Cigarettes    Last attempt to quit: 10/24/2016    Years since quitting: 1.3  . Smokeless tobacco: Never Used  . Tobacco comment: smokes socially, when having a drink  Substance Use Topics  . Alcohol use: Yes    Alcohol/week: 5.0 standard drinks    Types: 5 Cans of beer per week     Current Outpatient Medications:  .  buPROPion (WELLBUTRIN XL) 150 MG 24 hr tablet, Take 1 tablet (150 mg total) by mouth daily., Disp: 30 tablet, Rfl: 1 .  hydrOXYzine (ATARAX/VISTARIL) 25 MG tablet, Take 1 tablet (25 mg total) by mouth 2 (two) times daily as needed., Disp: 180 tablet, Rfl: 0  Allergies  Allergen Reactions  . Oxycodone Other (See Comments) and Nausea Only    Headache, Vomiting, fever and chills      I personally reviewed active problem list, medication list, allergies with the patient/caregiver today.  ROS  Ten systems reviewed and is negative except as mentioned in HPI  Objective  Vitals:   02/28/18 1454  BP: 120/82  Pulse: 84  Resp: 14  Temp: 98.3 F (36.8 C)  TempSrc: Oral  SpO2: 98%  Weight: 122 lb 14.4 oz (55.7 kg)  Height: 5' 2"  (1.575 m)     Body mass index is 22.48 kg/m.  Nursing Note and Vital Signs reviewed.  Physical Exam  Constitutional: Patient appears well-developed and well-nourished. No distress.  HENT: Head: Normocephalic and atraumatic. Ears: bilateral TMs with no erythema or effusion; Nose: Nose normal. Mouth/Throat: Oropharynx is clear and moist. No oropharyngeal exudate or tonsillar swelling.  Eyes: Conjunctivae and EOM are normal. No scleral icterus.  Pupils are equal, round, and reactive to light.  Neck: Normal range of motion. Neck supple. No JVD present. No thyromegaly present.  Cardiovascular: Normal rate, regular rhythm and normal heart sounds.  No murmur heard. No BLE edema. Pulmonary/Chest: Effort normal and breath sounds normal. No respiratory distress. Abdominal: Soft. Bowel sounds are normal, no distension. There is no tenderness. No masses. Musculoskeletal: Normal range of motion, no joint effusions. No gross  deformities. Strength equal and 5/5 to BLE. No joint swelling. There is tenderness to the left gluteus muscle.   Neurological: Pt is alert and oriented to person, place, and time. No cranial nerve deficit. Coordination, balance, strength, speech and gait are normal.  Skin: Skin is warm and dry. No rash noted. No erythema.  Psychiatric: Patient has a normal mood and affect. behavior is normal. Judgment and thought content normal.  No results found for this or any previous visit (from the past 72 hour(s)).  Assessment & Plan  1. Acute left-sided low back pain with left-sided sciatica - Gentle stretching discussed; apply heat  and massage PRN. - predniSONE (STERAPRED UNI-PAK 48 TAB) 5 MG (48) TBPK tablet; Take as directed  Dispense: 48 tablet; Refill: 0 - meloxicam (MOBIC) 15 MG tablet; Take 1 tablet (15 mg total) by mouth daily.  Dispense: 15 tablet; Refill: 0  2. Acute pain of left knee - meloxicam (MOBIC) 15 MG tablet; Take 1 tablet (15 mg total) by mouth daily.  Dispense: 15 tablet; Refill: 0 - Exercise/strengthening PRN, Ice PRN.  Return in about 2 weeks (around 03/14/2018), or if symptoms worsen or fail to improve.

## 2018-03-15 ENCOUNTER — Ambulatory Visit: Payer: 59 | Admitting: Family Medicine

## 2018-03-20 ENCOUNTER — Other Ambulatory Visit: Payer: Self-pay | Admitting: Family Medicine

## 2018-03-20 DIAGNOSIS — F321 Major depressive disorder, single episode, moderate: Secondary | ICD-10-CM

## 2018-03-20 MED ORDER — BUPROPION HCL ER (XL) 150 MG PO TB24: 150.0000 mg | ORAL_TABLET | Freq: Every day | ORAL | 1 refills | Status: DC

## 2018-03-20 NOTE — Telephone Encounter (Signed)
Refill request was sent to Dr. Krichna Sowles for approval and submission.  

## 2018-03-20 NOTE — Telephone Encounter (Signed)
She should have one refill left, given by Raquel Sarna. Sending 2 more and she will need follow up in 2-3 months

## 2018-03-20 NOTE — Telephone Encounter (Signed)
Copied from [place] [id number]. Topic: Quick Communication - Rx Refill/Question >> Mar 20, 2018  1:11 PM Sheran Luz wrote: Medication: buPROPion (WELLBUTRIN XL) 150 MG 24 hr tablet   Pt is requesting refill of this medication.   Has the patient contacted their pharmacy? Yes, pharmacy advised pt to contact office.   Preferred Pharmacy (with phone number or street name):[phone](Phone) [phone](Fax)

## 2018-05-10 ENCOUNTER — Other Ambulatory Visit: Payer: Self-pay | Admitting: Family Medicine

## 2018-05-10 DIAGNOSIS — F419 Anxiety disorder, unspecified: Secondary | ICD-10-CM

## 2018-05-10 DIAGNOSIS — L509 Urticaria, unspecified: Secondary | ICD-10-CM

## 2018-05-13 NOTE — Telephone Encounter (Signed)
Please schedule patient for a follow up with [name] or myself in the next month. Thanks!

## 2018-07-13 DIAGNOSIS — Z124 Encounter for screening for malignant neoplasm of cervix: Secondary | ICD-10-CM | POA: Insufficient documentation

## 2018-07-20 ENCOUNTER — Other Ambulatory Visit: Payer: Self-pay | Admitting: Family Medicine

## 2018-07-20 DIAGNOSIS — F321 Major depressive disorder, single episode, moderate: Secondary | ICD-10-CM

## 2018-07-21 NOTE — Telephone Encounter (Signed)
She needs an appointment , once scheduled I will send a refill

## 2018-07-22 NOTE — Telephone Encounter (Signed)
lvm [phone] asking pt to return call to schedule appt

## 2018-07-30 ENCOUNTER — Ambulatory Visit (INDEPENDENT_AMBULATORY_CARE_PROVIDER_SITE_OTHER): Payer: 59 | Admitting: Family Medicine

## 2018-07-30 ENCOUNTER — Encounter: Payer: Self-pay | Admitting: Family Medicine

## 2018-07-30 VITALS — BP 106/80 | HR 90 | Temp 98.3°F | Resp 16 | Ht 62.0 in | Wt 122.0 lb

## 2018-07-30 DIAGNOSIS — L509 Urticaria, unspecified: Secondary | ICD-10-CM

## 2018-07-30 DIAGNOSIS — F331 Major depressive disorder, recurrent, moderate: Secondary | ICD-10-CM | POA: Diagnosis not present

## 2018-07-30 DIAGNOSIS — F5104 Psychophysiologic insomnia: Secondary | ICD-10-CM

## 2018-07-30 DIAGNOSIS — F325 Major depressive disorder, single episode, in full remission: Secondary | ICD-10-CM | POA: Insufficient documentation

## 2018-07-30 DIAGNOSIS — Z1239 Encounter for other screening for malignant neoplasm of breast: Secondary | ICD-10-CM

## 2018-07-30 DIAGNOSIS — F321 Major depressive disorder, single episode, moderate: Secondary | ICD-10-CM | POA: Insufficient documentation

## 2018-07-30 DIAGNOSIS — N926 Irregular menstruation, unspecified: Secondary | ICD-10-CM

## 2018-07-30 DIAGNOSIS — F419 Anxiety disorder, unspecified: Secondary | ICD-10-CM

## 2018-07-30 MED ORDER — HYDROXYZINE HCL 50 MG PO TABS: 50.0000 mg | ORAL_TABLET | Freq: Every day | ORAL | 0 refills | Status: DC

## 2018-07-30 MED ORDER — DULOXETINE HCL 30 MG PO CPEP: 30.0000 mg | ORAL_CAPSULE | Freq: Every day | ORAL | 0 refills | Status: DC
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
## 2018-08-01 ENCOUNTER — Other Ambulatory Visit: Payer: Self-pay | Admitting: Family Medicine

## 2018-08-01 DIAGNOSIS — Z1231 Encounter for screening mammogram for malignant neoplasm of breast: Secondary | ICD-10-CM

## 2018-08-02 ENCOUNTER — Ambulatory Visit
Admission: RE | Admit: 2018-08-02 | Discharge: 2018-08-02 | Disposition: A | Discharge status: Home / Self Care | Payer: 59 | Source: Ambulatory Visit | Attending: Family Medicine | Admitting: Family Medicine

## 2018-08-02 DIAGNOSIS — Z1231 Encounter for screening mammogram for malignant neoplasm of breast: Secondary | ICD-10-CM | POA: Diagnosis present

## 2018-09-03 ENCOUNTER — Ambulatory Visit: Payer: 59 | Admitting: Family Medicine

## 2018-09-05 ENCOUNTER — Telehealth: Payer: Self-pay | Admitting: Family Medicine

## 2018-09-05 DIAGNOSIS — F331 Major depressive disorder, recurrent, moderate: Secondary | ICD-10-CM

## 2018-09-05 NOTE — Telephone Encounter (Signed)
Copied from [place] [phone] [phone]. Topic: Quick Communication - Rx Refill/Question >> Sep 05, 2018 10:50 AM Margot Ables wrote: Medication: DULoxetine (CYMBALTA) 30 MG capsule - pt called stating that med is working well for her. She will be out in a couple days. She cancelled her appt due to not wanting to go out since [place] is wide spread. Pt reports no side effects since taking.  Has the patient contacted their pharmacy? Yes - no refills - was supposed to have f/u visit with [name]  Preferred Pharmacy (with phone number or street name): [facility], [address] [phone] (Phone) [phone] (Fax)

## 2018-09-06 ENCOUNTER — Ambulatory Visit (INDEPENDENT_AMBULATORY_CARE_PROVIDER_SITE_OTHER): Payer: 59 | Admitting: Family Medicine

## 2018-09-06 ENCOUNTER — Encounter: Payer: Self-pay | Admitting: Family Medicine

## 2018-09-06 ENCOUNTER — Other Ambulatory Visit: Payer: Self-pay

## 2018-09-06 DIAGNOSIS — L509 Urticaria, unspecified: Secondary | ICD-10-CM | POA: Diagnosis not present

## 2018-09-06 DIAGNOSIS — F5104 Psychophysiologic insomnia: Secondary | ICD-10-CM | POA: Diagnosis not present

## 2018-09-06 DIAGNOSIS — F419 Anxiety disorder, unspecified: Secondary | ICD-10-CM | POA: Diagnosis not present

## 2018-09-06 DIAGNOSIS — F331 Major depressive disorder, recurrent, moderate: Secondary | ICD-10-CM | POA: Diagnosis not present

## 2018-09-06 MED ORDER — HYDROXYZINE HCL 50 MG PO TABS: 50.0000 mg | ORAL_TABLET | Freq: Every day | ORAL | 0 refills | Status: DC

## 2018-09-06 MED ORDER — DULOXETINE HCL 60 MG PO CPEP: 60.0000 mg | ORAL_CAPSULE | Freq: Every day | ORAL | 0 refills | Status: DC
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
## 2018-09-19 ENCOUNTER — Encounter: Payer: Self-pay | Admitting: Family Medicine

## 2018-11-19 ENCOUNTER — Ambulatory Visit
Admission: RE | Admit: 2018-11-19 | Discharge: 2018-11-19 | Disposition: A | Discharge status: Home / Self Care | Payer: 59 | Source: Ambulatory Visit | Attending: Family Medicine | Admitting: Family Medicine

## 2018-11-19 ENCOUNTER — Encounter: Payer: Self-pay | Admitting: Family Medicine

## 2018-11-19 ENCOUNTER — Ambulatory Visit (INDEPENDENT_AMBULATORY_CARE_PROVIDER_SITE_OTHER): Payer: 59 | Admitting: Family Medicine

## 2018-11-19 ENCOUNTER — Other Ambulatory Visit: Payer: Self-pay

## 2018-11-19 ENCOUNTER — Ambulatory Visit
Admission: RE | Admit: 2018-11-19 | Discharge: 2018-11-19 | Disposition: A | Discharge status: Home / Self Care | Payer: 59 | Attending: Family Medicine | Admitting: Family Medicine

## 2018-11-19 DIAGNOSIS — R1030 Lower abdominal pain, unspecified: Secondary | ICD-10-CM

## 2018-11-19 DIAGNOSIS — M5441 Lumbago with sciatica, right side: Secondary | ICD-10-CM | POA: Diagnosis not present

## 2018-11-19 DIAGNOSIS — N3001 Acute cystitis with hematuria: Secondary | ICD-10-CM | POA: Diagnosis not present

## 2018-11-19 LAB — POCT URINALYSIS DIPSTICK
Bilirubin, UA: NEGATIVE
Glucose, UA: NEGATIVE
Ketones, UA: NEGATIVE
Nitrite, UA: NEGATIVE
Protein, UA: POSITIVE — AB
Spec Grav, UA: 1.01 (ref 1.010–1.025)
Urobilinogen, UA: NEGATIVE E.U./dL — AB
pH, UA: 5 (ref 5.0–8.0)

## 2018-11-19 MED ORDER — TIZANIDINE HCL 4 MG PO TABS: 4.0000 mg | ORAL_TABLET | Freq: Four times a day (QID) | ORAL | 0 refills | Status: DC | PRN

## 2018-11-19 MED ORDER — SULFAMETHOXAZOLE-TRIMETHOPRIM 800-160 MG PO TABS: 1.0000 | ORAL_TABLET | Freq: Two times a day (BID) | ORAL | 0 refills | Status: AC

## 2018-11-19 NOTE — Progress Notes (Signed)
Name: Kara Harrell   MRN: [id number]    DOB: 18-Apr-1971   Date:11/19/2018       Progress Note  Subjective  Chief Complaint  Chief Complaint  Patient presents with  . Back Pain    onset 1 month and getting worse, lower right and radiates to front down leg    I connected with  [name] on 11/19/18 at 11:20 AM EDT by telephone and verified that I am speaking with the correct person using two identifiers.   I discussed the limitations, risks, security and privacy concerns of performing an evaluation and management service by telephone and the availability of in person appointments. Staff also discussed with the patient that there may be a patient responsible charge related to this service. Patient Location: Home Provider Location: Home Additional Individuals present: None  HPI  PT presents with concern for bilateral lower back pain worse on the right with pressure.  Sometimes radiates into the bilateral legs worse on the right. No numbness/tingling/weakness of any extremity. Ongoing for about a month, worse over the last couple of weeks. She does endorse occasional lower abdominal pain.  The back pain is constant, not able to sleep well because of the pain.  - No recent injury or overuse. - She had skipped May menses, and spotted in June. - Endorses nausea.  No vomiting, fevers/chills.  Patient Active Problem List   Diagnosis Date Noted  . Moderate single current episode of major depressive disorder ([place]) 07/30/2018  . Stricture and stenosis of esophagus   . Grieving 06/01/2015  . Major depression ([place]) 11/11/2014    Social History   Tobacco Use  . Smoking status: Former Smoker    Packs/day: 0.50    Years: 20.00    Pack years: 10.00    Types: Cigarettes    Quit date: 10/24/2016    Years since quitting: 2.0  . Smokeless tobacco: Never Used  . Tobacco comment: smokes socially, when having a drink  Substance Use Topics  . Alcohol use: Yes    Alcohol/week: 5.0  standard drinks    Types: 5 Cans of beer per week     Current Outpatient Medications:  .  DULoxetine (CYMBALTA) 60 MG capsule, Take 1 capsule (60 mg total) by mouth daily., Disp: 90 capsule, Rfl: 0 .  hydrOXYzine (ATARAX/VISTARIL) 50 MG tablet, Take 1 tablet (50 mg total) by mouth at bedtime., Disp: 90 tablet, Rfl: 0 .  tiZANidine (ZANAFLEX) 4 MG tablet, Take 1 tablet (4 mg total) by mouth every 6 (six) hours as needed for muscle spasms., Disp: 30 tablet, Rfl: 0  Allergies  Allergen Reactions  . Oxycodone Nausea Only and Other (See Comments)    Headache, Vomiting, fever and chills  Would like this taken off- because she was told she took too much and that is why she had this reaction    I personally reviewed active problem list, medication list, allergies, lab results with the patient/caregiver today.  ROS  Ten systems reviewed and is negative except as mentioned in HPI   Objective  Virtual encounter, vitals not obtained.  There is no height or weight on file to calculate BMI.  Nursing Note and Vital Signs reviewed.  Physical Exam  Constitutional: Patient appears well-developed and well-nourished. No distress.  HENT: Head: Normocephalic and atraumatic.  Neck: Normal range of motion. Pulmonary/Chest: Effort normal. No respiratory distress. Speaking in complete sentences Neurological: Pt is alert and oriented to person, place, and time. Coordination, speech and  gait are normal.  Psychiatric: Patient has a normal mood and affect. behavior is normal. Judgment and thought content normal.   Results for orders placed or performed in visit on 11/19/18 (from the past 72 hour(s))  POCT urinalysis dipstick     Status: Abnormal   Collection Time: 11/19/18 12:04 PM  Result Value Ref Range   Color, UA gold    Clarity, UA cloudy    Glucose, UA Negative Negative   Bilirubin, UA negative    Ketones, UA negative    Spec Grav, UA 1.010 1.010 - 1.025   Blood, UA trace    pH, UA 5.0  5.0 - 8.0   Protein, UA Positive (A) Negative   Urobilinogen, UA negative (A) 0.2 or 1.0 E.U./dL   Nitrite, UA negative    Leukocytes, UA Trace (A) Negative   Appearance cloudy    Odor none     Assessment & Plan  1. Acute bilateral low back pain with right-sided sciatica - DDX: Sciatica, nephrolithiasis, constipation, cystitis, GYN concern (fibroid?).  Will trial muscle relaxer - if this helps, will send in pred taper for sciatica.  Will have patient come to office today for UA dip - will treat if UTI.  She will have KUB first, if needing additional imaging, will order.  Red flags reviewed. - tiZANidine (ZANAFLEX) 4 MG tablet; Take 1 tablet (4 mg total) by mouth every 6 (six) hours as needed for muscle spasms.  Dispense: 30 tablet; Refill: 0  2. Lower abdominal pain - POCT urinalysis dipstick - DG Abd 1 View; Future  -Red flags and when to present for emergency care or RTC including fever >101.17F, chest pain, shortness of breath, new/worsening/un-resolving symptoms, reviewed with patient at time of visit. Follow up and care instructions discussed and provided in AVS. - I discussed the assessment and treatment plan with the patient. The patient was provided an opportunity to ask questions and all were answered. The patient agreed with the plan and demonstrated an understanding of the instructions.  - The patient was advised to call back or seek an in-person evaluation if the symptoms worsen or if the condition fails to improve as anticipated.  I provided 27 minutes of non-face-to-face time during this encounter.  Hubbard Hartshorn, FNP    Addendum: Patient came for UA - +protein, trace leuks and trace blood - will treat for possible UTI with bactrim while we await other testing. Urine Culture is added.

## 2018-11-20 ENCOUNTER — Encounter: Payer: Self-pay | Admitting: Emergency Medicine

## 2018-11-20 ENCOUNTER — Telehealth: Payer: Self-pay

## 2018-11-20 ENCOUNTER — Emergency Department: Payer: 59

## 2018-11-20 ENCOUNTER — Other Ambulatory Visit: Payer: Self-pay

## 2018-11-20 ENCOUNTER — Emergency Department
Admission: EM | Admit: 2018-11-20 | Discharge: 2018-11-20 | Disposition: A | Discharge status: Home / Self Care | Payer: 59 | Attending: Emergency Medicine | Admitting: Emergency Medicine

## 2018-11-20 DIAGNOSIS — M545 Low back pain, unspecified: Secondary | ICD-10-CM

## 2018-11-20 DIAGNOSIS — Z87891 Personal history of nicotine dependence: Secondary | ICD-10-CM | POA: Diagnosis not present

## 2018-11-20 DIAGNOSIS — Z79899 Other long term (current) drug therapy: Secondary | ICD-10-CM | POA: Insufficient documentation

## 2018-11-20 DIAGNOSIS — R1031 Right lower quadrant pain: Secondary | ICD-10-CM | POA: Insufficient documentation

## 2018-11-20 DIAGNOSIS — K529 Noninfective gastroenteritis and colitis, unspecified: Secondary | ICD-10-CM

## 2018-11-20 LAB — URINALYSIS, COMPLETE (UACMP) WITH MICROSCOPIC
Bacteria, UA: NONE SEEN
Bilirubin Urine: NEGATIVE
Glucose, UA: NEGATIVE mg/dL
Hgb urine dipstick: NEGATIVE
Ketones, ur: NEGATIVE mg/dL
Leukocytes,Ua: NEGATIVE
Nitrite: NEGATIVE
Protein, ur: NEGATIVE mg/dL
Specific Gravity, Urine: 1.005 (ref 1.005–1.030)
pH: 7 (ref 5.0–8.0)

## 2018-11-20 LAB — CBC
HCT: 43.9 % (ref 36.0–46.0)
Hemoglobin: 14.1 g/dL (ref 12.0–15.0)
MCH: 29.6 pg (ref 26.0–34.0)
MCHC: 32.1 g/dL (ref 30.0–36.0)
MCV: 92.2 fL (ref 80.0–100.0)
Platelets: 275 10*3/uL (ref 150–400)
RBC: 4.76 MIL/uL (ref 3.87–5.11)
RDW: 12.7 % (ref 11.5–15.5)
WBC: 6.8 10*3/uL (ref 4.0–10.5)
nRBC: 0 % (ref 0.0–0.2)

## 2018-11-20 LAB — COMPREHENSIVE METABOLIC PANEL
ALT: 24 U/L (ref 0–44)
AST: 32 U/L (ref 15–41)
Albumin: 3.9 g/dL (ref 3.5–5.0)
Alkaline Phosphatase: 98 U/L (ref 38–126)
Anion gap: 9 (ref 5–15)
BUN: 14 mg/dL (ref 6–20)
CO2: 25 mmol/L (ref 22–32)
Calcium: 8.8 mg/dL — ABNORMAL LOW (ref 8.9–10.3)
Chloride: 102 mmol/L (ref 98–111)
Creatinine, Ser: 0.89 mg/dL (ref 0.44–1.00)
GFR calc Af Amer: >60 mL/min (ref >60)
GFR calc non Af Amer: >60 mL/min (ref >60)
Glucose, Bld: 104 mg/dL — ABNORMAL HIGH (ref 70–99)
Potassium: 3.7 mmol/L (ref 3.5–5.1)
Sodium: 136 mmol/L (ref 135–145)
Total Bilirubin: 0.4 mg/dL (ref 0.3–1.2)
Total Protein: 7.6 g/dL (ref 6.5–8.1)

## 2018-11-20 LAB — POCT PREGNANCY, URINE: Preg Test, Ur: NEGATIVE

## 2018-11-20 LAB — LIPASE, BLOOD: Lipase: 21 U/L (ref 11–51)

## 2018-11-20 LAB — URINE CULTURE
MICRO NUMBER:: 598574
SPECIMEN QUALITY:: ADEQUATE

## 2018-11-20 MED ORDER — OXYCODONE-ACETAMINOPHEN 5-325 MG PO TABS: 1.0000 | ORAL_TABLET | ORAL | 0 refills | Status: DC | PRN

## 2018-11-20 MED ORDER — IOHEXOL 240 MG/ML SOLN
50.0000 mL | Freq: Once | INTRAMUSCULAR | Status: AC
  Administered 2018-11-20: 50 mL via ORAL

## 2018-11-20 MED ORDER — IOHEXOL 300 MG/ML  SOLN
75.0000 mL | Freq: Once | INTRAMUSCULAR | Status: AC | PRN
  Administered 2018-11-20: 75 mL via INTRAVENOUS

## 2018-11-20 MED ORDER — DICYCLOMINE HCL 20 MG PO TABS: 20.0000 mg | ORAL_TABLET | Freq: Three times a day (TID) | ORAL | 0 refills | Status: DC | PRN

## 2018-11-20 NOTE — Progress Notes (Signed)
Patient notified

## 2018-11-20 NOTE — ED Notes (Signed)
EDP at bedside  

## 2018-11-20 NOTE — ED Notes (Signed)
Assisted pt to bathroom

## 2018-11-20 NOTE — ED Notes (Signed)
Patient transported to CT 

## 2018-11-20 NOTE — ED Notes (Signed)
Lab called for urine- stated they would run it

## 2018-11-20 NOTE — ED Notes (Signed)
Signature pad in room does not work- printed discharge paper and had pt sign

## 2018-11-20 NOTE — ED Notes (Signed)
Green, purple tubes,and urine specimen sent to the lab.

## 2018-11-20 NOTE — Telephone Encounter (Signed)
Pt notified of results, she states is in really bad pain and could not sleep last night.  I told her if she is in really bad pain/severe like she says she needs to go on to ER for further testing/imaging.

## 2018-11-20 NOTE — ED Notes (Signed)
Pt ambulatory to room 7

## 2018-11-20 NOTE — Telephone Encounter (Signed)
Copied from [place] [id number]. Topic: General - Inquiry >> Nov 20, 2018  9:19 AM Reyne Dumas L wrote: Reason for CRM:   Pt calling to find out the results of urinalysis and ultrasound done yesterday.  Pt states she is still in pain. Pt can be reached at [phone]

## 2018-11-20 NOTE — ED Provider Notes (Signed)
Patient's CT returned and was concerning for possible colitis. Discussed this finding with the patient. This could explain some of the patient's abdominal complaints today, although think it might not explain the month long course of pain. Do feel however it would be worth while for patient to trail antibiotics for colitis. Was placed on bactrim yesterday for possible UTI. Did discuss with patient to continue antibiotics.  Will give patient short course of pain medication as well. Discussed plan with patient.    Nance Pear, MD 11/20/18 [phone]

## 2018-11-20 NOTE — ED Triage Notes (Addendum)
Pt c/o lower back pain across whole back.  Worse with any movement and radiates into groin and down right leg.  Hx of sciatica and this feels different.  Saw PCP yesterday and was given muscle relaxer and abx but getting worse.  Pain has been for one month.  Pt also c/o feeling bloated and like stomach is swollen.  UA done at PCP yesterday.  Has had normal period every month until last month. Any movement makes pain worse.  States "feels like something is compressed back there".  Some abdominal pain at times.  Describes pain in back as contractions. No loss bowel or bladder

## 2018-11-20 NOTE — ED Notes (Signed)
Pt states pain was making her vomit last night- pain started in her back and radiates to her right leg and abdomen

## 2018-11-20 NOTE — ED Notes (Signed)
Called CT to let them know pt was ready

## 2018-11-20 NOTE — ED Provider Notes (Signed)
[facility] Emergency Department Provider Note  Time seen: 1:36 PM  I have reviewed the triage vital signs and the nursing notes.   HISTORY  Chief Complaint Back Pain and Abdominal Pain   HPI Kara Harrell is a 48 y.o. female with a past medical history of anxiety, depression, presents to the emergency department for right lower back pain radiating to his right groin.  According to the patient over the past 2 to 3 weeks she has been experiencing intermittent pain to her right back radiating around her right lower abdomen and sometimes down her right leg.  Patient describes the pain as a fairly constant 7/10 pain with occasional extremely severe 10/10 sharp pain that causes her to be nauseated with vomiting.  Patient denies any dysuria, hematuria or history of kidney stones.  Does state the pain is worse with certain movements.  Denies any known fever.  No cough congestion or shortness of breath.   Past Medical History:  Diagnosis Date  . Anxiety   . Depression     Patient Active Problem List   Diagnosis Date Noted  . Moderate single current episode of major depressive disorder ([place]) 07/30/2018  . Stricture and stenosis of esophagus   . Grieving 06/01/2015  . Major depression ([place]) 11/11/2014    Past Surgical History:  Procedure Laterality Date  . CESAREAN SECTION    . Cyst Removed from ovary    . ESOPHAGOGASTRODUODENOSCOPY (EGD) WITH PROPOFOL N/A 10/18/2015   Procedure: ESOPHAGOGASTRODUODENOSCOPY (EGD) WITH PROPOFOL with dialation;  Surgeon: Lucilla Lame, MD;  Location: [place];  Service: Endoscopy;  Laterality: N/A;  . FOOT SURGERY      Prior to Admission medications   Medication Sig Start Date End Date Taking? Authorizing Provider  DULoxetine (CYMBALTA) 60 MG capsule Take 1 capsule (60 mg total) by mouth daily. 09/06/18   Steele Sizer, MD  hydrOXYzine (ATARAX/VISTARIL) 50 MG tablet Take 1 tablet (50 mg total) by mouth at bedtime.  09/06/18   Steele Sizer, MD  sulfamethoxazole-trimethoprim (BACTRIM DS) 800-160 MG tablet Take 1 tablet by mouth 2 (two) times daily for 5 days. 11/19/18 11/24/18  Hubbard Hartshorn, FNP  tiZANidine (ZANAFLEX) 4 MG tablet Take 1 tablet (4 mg total) by mouth every 6 (six) hours as needed for muscle spasms. 11/19/18   Hubbard Hartshorn, FNP    No Known Allergies  Family History  Problem Relation Age of Onset  . Cancer Mother        Lung  . Diabetes Sister   . Breast cancer Neg Hx     Social History Social History   Tobacco Use  . Smoking status: Former Smoker    Packs/day: 0.50    Years: 20.00    Pack years: 10.00    Types: Cigarettes    Quit date: 10/24/2016    Years since quitting: 2.0  . Smokeless tobacco: Never Used  . Tobacco comment: smokes socially, when having a drink  Substance Use Topics  . Alcohol use: Yes    Alcohol/week: 5.0 standard drinks    Types: 5 Cans of beer per week  . Drug use: No    Review of Systems Constitutional: Negative for fever. Cardiovascular: Negative for chest pain. Respiratory: Negative for shortness of breath. Gastrointestinal: Negative for abdominal pain, vomiting Genitourinary: Negative for hematuria/dysuria Musculoskeletal: Negative for musculoskeletal complaints Skin: Negative for skin complaints  Neurological: Negative for headache All other ROS negative  ____________________________________________   PHYSICAL EXAM:  VITAL SIGNS: ED  Triage Vitals [11/20/18 1155]  Enc Vitals Group     BP (!) 118/58     Pulse Rate 84     Resp 18     Temp 97.9 F (36.6 C)     Temp Source Oral     SpO2 99 %     Weight 120 lb (54.4 kg)     Height 5' 2"  (1.575 m)     Head Circumference      Peak Flow      Pain Score 10     Pain Loc      Pain Edu?      Excl. in [place]?    Constitutional: Alert and oriented. Well appearing and in no distress. Eyes: Normal exam ENT      Head: Normocephalic and atraumatic.      Mouth/Throat: Mucous membranes  are moist. Cardiovascular: Normal rate, regular rhythm. No murmur Respiratory: Normal respiratory effort without tachypnea nor retractions. Breath sounds are clear Gastrointestinal: Soft, very slight right flank/right lower quadrant tenderness.  No rebound guarding or distention. Musculoskeletal: Nontender with normal range of motion in all extremities.  Neurologic:  Normal speech and language. No gross focal neurologic deficits  Skin:  Skin is warm, dry and intact.  Psychiatric: Mood and affect are normal.     RADIOLOGY  CT pending  ____________________________________________   INITIAL IMPRESSION / ASSESSMENT AND PLAN / ED COURSE  Pertinent labs & imaging results that were available during my care of the patient were reviewed by me and considered in my medical decision making (see chart for details).   Patient presents emergency department for right back pain radiating to her right groin/right leg.  Differential this time would include sciatica/radicular pain, appendicitis, ureterolithiasis, pyelonephritis, urinary tract infection, musculoskeletal pain, muscular spasm, biliary colic, colitis or diverticulitis.  Patient's lab work is reassuring, we will proceed with CT imaging of the abdomen to further evaluate.  Patient agreeable to plan of care.  We will treat pain and nausea as well as IV hydrate while awaiting CT results.  Patient's blood work is largely within normal limits.  CT scan and urinalysis is pending.  Patient care signed out to oncoming physician.  SHWANDA Harrell was evaluated in Emergency Department on 11/20/2018 for the symptoms described in the history of present illness. She was evaluated in the context of the global COVID-19 pandemic, which necessitated consideration that the patient might be at risk for infection with the SARS-CoV-2 virus that causes COVID-19. Institutional protocols and algorithms that pertain to the evaluation of patients at risk for COVID-19 are in  a state of rapid change based on information released by regulatory bodies including the CDC and federal and state organizations. These policies and algorithms were followed during the patient's care in the ED.  ____________________________________________   FINAL CLINICAL IMPRESSION(S) / ED DIAGNOSES  Right flank pain   Harvest Dark, MD 11/20/18 1437

## 2018-11-20 NOTE — Discharge Instructions (Addendum)
Please seek medical attention for any high fevers, chest pain, shortness of breath, change in behavior, persistent vomiting, bloody stool or any other new or concerning symptoms.  

## 2018-11-20 NOTE — Telephone Encounter (Signed)
Documentation reviewed and I am in agreement with disposition to go to ER or UC

## 2018-11-28 ENCOUNTER — Encounter: Payer: Self-pay | Admitting: Family Medicine

## 2018-11-28 ENCOUNTER — Ambulatory Visit (INDEPENDENT_AMBULATORY_CARE_PROVIDER_SITE_OTHER): Payer: 59 | Admitting: Family Medicine

## 2018-11-28 ENCOUNTER — Other Ambulatory Visit: Payer: Self-pay

## 2018-11-28 VITALS — BP 130/80 | HR 89 | Temp 97.1°F | Resp 16 | Ht 62.0 in | Wt 123.9 lb

## 2018-11-28 DIAGNOSIS — F419 Anxiety disorder, unspecified: Secondary | ICD-10-CM

## 2018-11-28 DIAGNOSIS — I700 Atherosclerosis of aorta: Secondary | ICD-10-CM | POA: Diagnosis not present

## 2018-11-28 DIAGNOSIS — K529 Noninfective gastroenteritis and colitis, unspecified: Secondary | ICD-10-CM | POA: Diagnosis not present

## 2018-11-28 DIAGNOSIS — L509 Urticaria, unspecified: Secondary | ICD-10-CM

## 2018-11-28 DIAGNOSIS — F331 Major depressive disorder, recurrent, moderate: Secondary | ICD-10-CM

## 2018-11-28 DIAGNOSIS — F5104 Psychophysiologic insomnia: Secondary | ICD-10-CM

## 2018-11-28 MED ORDER — HYDROXYZINE HCL 50 MG PO TABS: 50.0000 mg | ORAL_TABLET | Freq: Every day | ORAL | 0 refills | Status: DC

## 2018-11-28 MED ORDER — DULOXETINE HCL 60 MG PO CPEP: 60.0000 mg | ORAL_CAPSULE | Freq: Every day | ORAL | 0 refills | Status: DC
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
## 2018-12-26 ENCOUNTER — Other Ambulatory Visit: Payer: Self-pay | Admitting: Family Medicine

## 2018-12-26 NOTE — Telephone Encounter (Signed)
Medication Refill - Medication: dicyclomine (BENTYL) 20 MG tablet    Has the patient contacted their pharmacy? Yes.   (Agent: If no, request that the patient contact the pharmacy for the refill.) (Agent: If yes, when and what did the pharmacy advise?)  Preferred Pharmacy (with phone number or street name):  [place], [place] [phone] (Phone) [phone] (Fax)     Agent: Please be advised that RX refills may take up to 3 business days. We ask that you follow-up with your pharmacy.

## 2018-12-27 MED ORDER — DICYCLOMINE HCL 20 MG PO TABS: 20.0000 mg | ORAL_TABLET | Freq: Three times a day (TID) | ORAL | 0 refills | Status: DC | PRN

## 2019-01-09 ENCOUNTER — Other Ambulatory Visit: Payer: Self-pay

## 2019-01-09 ENCOUNTER — Emergency Department
Admission: EM | Admit: 2019-01-09 | Discharge: 2019-01-09 | Disposition: A | Discharge status: Home / Self Care | Payer: 59 | Attending: Emergency Medicine | Admitting: Emergency Medicine

## 2019-01-09 DIAGNOSIS — R109 Unspecified abdominal pain: Secondary | ICD-10-CM | POA: Insufficient documentation

## 2019-01-09 DIAGNOSIS — M549 Dorsalgia, unspecified: Secondary | ICD-10-CM | POA: Insufficient documentation

## 2019-01-09 DIAGNOSIS — Z5321 Procedure and treatment not carried out due to patient leaving prior to being seen by health care provider: Secondary | ICD-10-CM | POA: Insufficient documentation

## 2019-01-09 LAB — COMPREHENSIVE METABOLIC PANEL
ALT: 18 U/L (ref 0–44)
AST: 26 U/L (ref 15–41)
Albumin: 3.9 g/dL (ref 3.5–5.0)
Alkaline Phosphatase: 80 U/L (ref 38–126)
Anion gap: 10 (ref 5–15)
BUN: 13 mg/dL (ref 6–20)
CO2: 26 mmol/L (ref 22–32)
Calcium: 8.8 mg/dL — ABNORMAL LOW (ref 8.9–10.3)
Chloride: 92 mmol/L — ABNORMAL LOW (ref 98–111)
Creatinine, Ser: 0.63 mg/dL (ref 0.44–1.00)
GFR calc Af Amer: >60 mL/min (ref >60)
GFR calc non Af Amer: >60 mL/min (ref >60)
Glucose, Bld: 109 mg/dL — ABNORMAL HIGH (ref 70–99)
Potassium: 3.6 mmol/L (ref 3.5–5.1)
Sodium: 128 mmol/L — ABNORMAL LOW (ref 135–145)
Total Bilirubin: 0.5 mg/dL (ref 0.3–1.2)
Total Protein: 7 g/dL (ref 6.5–8.1)

## 2019-01-09 LAB — URINALYSIS, COMPLETE (UACMP) WITH MICROSCOPIC
Bacteria, UA: NONE SEEN
Bilirubin Urine: NEGATIVE
Glucose, UA: NEGATIVE mg/dL
Ketones, ur: NEGATIVE mg/dL
Leukocytes,Ua: NEGATIVE
Nitrite: NEGATIVE
Protein, ur: NEGATIVE mg/dL
Specific Gravity, Urine: 1.009 (ref 1.005–1.030)
pH: 6 (ref 5.0–8.0)

## 2019-01-09 LAB — CBC
HCT: 38.1 % (ref 36.0–46.0)
Hemoglobin: 12.8 g/dL (ref 12.0–15.0)
MCH: 29.4 pg (ref 26.0–34.0)
MCHC: 33.6 g/dL (ref 30.0–36.0)
MCV: 87.6 fL (ref 80.0–100.0)
Platelets: 274 10*3/uL (ref 150–400)
RBC: 4.35 MIL/uL (ref 3.87–5.11)
RDW: 12.7 % (ref 11.5–15.5)
WBC: 11.3 10*3/uL — ABNORMAL HIGH (ref 4.0–10.5)
nRBC: 0 % (ref 0.0–0.2)

## 2019-01-09 LAB — LIPASE, BLOOD: Lipase: 21 U/L (ref 11–51)

## 2019-01-09 MED ORDER — SODIUM CHLORIDE 0.9% FLUSH: 3.0000 mL | Freq: Once | INTRAVENOUS | Status: DC

## 2019-01-09 NOTE — ED Triage Notes (Addendum)
Pt comes via POV from c/o back pain and feet numbness. Pt states this started yesterday and has gotten worse.  Pt states diarrhea and nausea. Pt states urinary urgency and then difficulty getting the urine to flow. Pt states little burning.  Pt states lower right sided back pain and around to belly. Pt states some pain under her ribs. Pt also reports fever and chills.

## 2019-01-10 ENCOUNTER — Other Ambulatory Visit: Payer: Self-pay

## 2019-01-10 ENCOUNTER — Ambulatory Visit (INDEPENDENT_AMBULATORY_CARE_PROVIDER_SITE_OTHER): Payer: 59 | Admitting: Family Medicine

## 2019-01-10 ENCOUNTER — Encounter: Payer: Self-pay | Admitting: Family Medicine

## 2019-01-10 ENCOUNTER — Telehealth: Payer: Self-pay | Admitting: Family Medicine

## 2019-01-10 ENCOUNTER — Other Ambulatory Visit
Admission: RE | Admit: 2019-01-10 | Discharge: 2019-01-10 | Disposition: A | Discharge status: Home / Self Care | Payer: 59 | Source: Ambulatory Visit | Attending: Family Medicine | Admitting: Family Medicine

## 2019-01-10 DIAGNOSIS — E871 Hypo-osmolality and hyponatremia: Secondary | ICD-10-CM | POA: Insufficient documentation

## 2019-01-10 DIAGNOSIS — Z8719 Personal history of other diseases of the digestive system: Secondary | ICD-10-CM

## 2019-01-10 DIAGNOSIS — D72829 Elevated white blood cell count, unspecified: Secondary | ICD-10-CM

## 2019-01-10 DIAGNOSIS — M5441 Lumbago with sciatica, right side: Secondary | ICD-10-CM

## 2019-01-10 LAB — COMPREHENSIVE METABOLIC PANEL
ALT: 16 U/L (ref 0–44)
AST: 24 U/L (ref 15–41)
Albumin: 3.8 g/dL (ref 3.5–5.0)
Alkaline Phosphatase: 86 U/L (ref 38–126)
Anion gap: 8 (ref 5–15)
BUN: 15 mg/dL (ref 6–20)
CO2: 25 mmol/L (ref 22–32)
Calcium: 8.6 mg/dL — ABNORMAL LOW (ref 8.9–10.3)
Chloride: 102 mmol/L (ref 98–111)
Creatinine, Ser: 0.79 mg/dL (ref 0.44–1.00)
GFR calc Af Amer: >60 mL/min (ref >60)
GFR calc non Af Amer: >60 mL/min (ref >60)
Glucose, Bld: 103 mg/dL — ABNORMAL HIGH (ref 70–99)
Potassium: 4.1 mmol/L (ref 3.5–5.1)
Sodium: 135 mmol/L (ref 135–145)
Total Bilirubin: 0.6 mg/dL (ref 0.3–1.2)
Total Protein: 7.7 g/dL (ref 6.5–8.1)

## 2019-01-10 LAB — CBC WITH DIFFERENTIAL/PLATELET
Abs Immature Granulocytes: 0.04 10*3/uL (ref 0.00–0.07)
Basophils Absolute: 0 10*3/uL (ref 0.0–0.1)
Basophils Relative: 0 %
Eosinophils Absolute: 0.2 10*3/uL (ref 0.0–0.5)
Eosinophils Relative: 3 %
HCT: 41.1 % (ref 36.0–46.0)
Hemoglobin: 13.6 g/dL (ref 12.0–15.0)
Immature Granulocytes: 0 %
Lymphocytes Relative: 14 %
Lymphs Abs: 1.3 10*3/uL (ref 0.7–4.0)
MCH: 29.6 pg (ref 26.0–34.0)
MCHC: 33.1 g/dL (ref 30.0–36.0)
MCV: 89.3 fL (ref 80.0–100.0)
Monocytes Absolute: 0.6 10*3/uL (ref 0.1–1.0)
Monocytes Relative: 6 %
Neutro Abs: 7.1 10*3/uL (ref 1.7–7.7)
Neutrophils Relative %: 77 %
Platelets: 271 10*3/uL (ref 150–400)
RBC: 4.6 MIL/uL (ref 3.87–5.11)
RDW: 13 % (ref 11.5–15.5)
WBC: 9.3 10*3/uL (ref 4.0–10.5)
nRBC: 0 % (ref 0.0–0.2)

## 2019-01-10 NOTE — Telephone Encounter (Signed)
Pt called stating she is in extreme pain in her back, experiencing dizziness, and tingling in her feet. Pt states she believes she is having a flare up in her intestines. Pt states she took old pain medication. Please advise.
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
## 2019-01-14 ENCOUNTER — Ambulatory Visit (INDEPENDENT_AMBULATORY_CARE_PROVIDER_SITE_OTHER): Payer: 59 | Admitting: Gastroenterology

## 2019-01-14 ENCOUNTER — Other Ambulatory Visit: Payer: Self-pay

## 2019-01-14 ENCOUNTER — Encounter: Payer: Self-pay | Admitting: Gastroenterology

## 2019-01-14 VITALS — BP 106/77 | HR 92 | Temp 98.2°F | Resp 17 | Ht 62.0 in | Wt 122.4 lb

## 2019-01-14 DIAGNOSIS — R1030 Lower abdominal pain, unspecified: Secondary | ICD-10-CM

## 2019-01-14 DIAGNOSIS — R933 Abnormal findings on diagnostic imaging of other parts of digestive tract: Secondary | ICD-10-CM

## 2019-01-14 DIAGNOSIS — K639 Disease of intestine, unspecified: Secondary | ICD-10-CM

## 2019-01-14 DIAGNOSIS — R1310 Dysphagia, unspecified: Secondary | ICD-10-CM | POA: Diagnosis not present

## 2019-01-14 DIAGNOSIS — K51919 Ulcerative colitis, unspecified with unspecified complications: Secondary | ICD-10-CM

## 2019-01-14 DIAGNOSIS — K219 Gastro-esophageal reflux disease without esophagitis: Secondary | ICD-10-CM

## 2019-01-14 NOTE — Patient Instructions (Signed)

## 2019-01-14 NOTE — Progress Notes (Signed)
Cephas Darby, MD [address]  [place]  [place], [place] [address]  Main: [phone]  Fax: [phone]    Gastroenterology Consultation  Referring Provider:     Steele Sizer, MD Primary Care Physician:  Steele Sizer, MD Primary Gastroenterologist:  Dr. Cephas Darby Reason for Consultation:     Lower abdominal pain, colitis on CT, dysphagia, heartburn        HPI:   Kara Harrell is a 48 y.o. female referred by Dr. Steele Sizer, MD  for consultation & management of lower abdominal pain, abnormal CT colon, dysphagia and heartburn  Lower abdominal pain and colitis on CT scan: Patient went to ER on 6/24 secondary to severe lower abdominal pain radiating to lower back.  Her labs were unremarkable, CT abdomen and pelvis with contrast revealed thickening of the transverse and descending colon.  She was told to make changes in her diet, follow a low fiber, low residue.  Her symptoms resolved.  She had another attack last week, had severe lower abdominal, pelvic pain and radiating to the low back and constipation for 3 days.  She went to ER, found to have mild leukocytosis only.  She had repeat labs 24 hours later which were unremarkable.  She is referred to GI for further evaluation.  She had one episode of diarrhea yesterday, feels bloated.  She denies rectal bleeding.  She is not on any antibiotics.  She denies nausea or vomiting.  She reports subjective fevers.  She was off work for about 1 and half weeks due to these episodes.  She is a [employer] manager.  She denies any weight loss.  Denies prior episodes.  She drinks alcohol about 5 drinks per week.  She does not smoke.  She denies family history of IBD.  She does report tingling and numbness in her bilateral toes, purplish discoloration of her toenails. She reports history of endometriosis.  Not menopausal  Dysphagia, intermittent.  She has sensation of feeling stuck in her upper chest, intermittent heartburn.  She  underwent EGD in 2017, found to have mild stenosis which was dilated by Dr. Allen Norris   NSAIDs: None  Antiplts/Anticoagulants/Anti thrombotics: None  GI Procedures:  EGD 10/18/2015 - Benign-appearing esophageal stenosis. Dilated. - Gastritis. Biopsied. - Normal examined duodenum.  Past Medical History:  Diagnosis Date  . Anxiety   . Depression     Past Surgical History:  Procedure Laterality Date  . CESAREAN SECTION    . Cyst Removed from ovary    . ESOPHAGOGASTRODUODENOSCOPY (EGD) WITH PROPOFOL N/A 10/18/2015   Procedure: ESOPHAGOGASTRODUODENOSCOPY (EGD) WITH PROPOFOL with dialation;  Surgeon: Lucilla Lame, MD;  Location: [facility];  Service: Endoscopy;  Laterality: N/A;  . FOOT SURGERY      Current Outpatient Medications:  .  dicyclomine (BENTYL) 20 MG tablet, Take 1 tablet (20 mg total) by mouth 3 (three) times daily as needed., Disp: 30 tablet, Rfl: 0 .  DULoxetine (CYMBALTA) 60 MG capsule, Take 1 capsule (60 mg total) by mouth daily., Disp: 90 capsule, Rfl: 0 .  hydrOXYzine (ATARAX/VISTARIL) 50 MG tablet, Take 1 tablet (50 mg total) by mouth at bedtime., Disp: 90 tablet, Rfl: 0 .  oxyCODONE-acetaminophen (PERCOCET) 5-325 MG tablet, Take 1 tablet by mouth every 4 (four) hours as needed for severe pain (break through pain)., Disp: 10 tablet, Rfl: 0    Family History  Problem Relation Age of Onset  . Cancer Mother        Lung  .  Diabetes Sister   . Breast cancer Neg Hx      Social History   Tobacco Use  . Smoking status: Former Smoker    Packs/day: 0.50    Years: 20.00    Pack years: 10.00    Types: Cigarettes    Quit date: 10/24/2016    Years since quitting: 2.2  . Smokeless tobacco: Never Used  . Tobacco comment: smokes socially, when having a drink  Substance Use Topics  . Alcohol use: Yes    Alcohol/week: 5.0 standard drinks    Types: 5 Cans of beer per week  . Drug use: No    Allergies as of 01/14/2019  . (No Known Allergies)    Review of  Systems:    All systems reviewed and negative except where noted in HPI.   Physical Exam:  BP 106/77 (BP Location: Left Arm, Patient Position: Sitting, Cuff Size: Normal)   Pulse 92   Temp 98.2 F (36.8 C)   Resp 17   Ht 5' 2"  (1.575 m)   Wt 122 lb 6.4 oz (55.5 kg)   BMI 22.39 kg/m  No LMP recorded. Patient is premenopausal.  General:   Alert,  Well-developed, well-nourished, pleasant and cooperative in NAD Head:  Normocephalic and atraumatic. Eyes:  Sclera clear, no icterus.   Conjunctiva pink. Ears:  Normal auditory acuity. Nose:  No deformity, discharge, or lesions. Mouth:  No deformity or lesions,oropharynx pink & moist. Neck:  Supple; no masses or thyromegaly. Lungs:  Respirations even and unlabored.  Clear throughout to auscultation.   No wheezes, crackles, or rhonchi. No acute distress. Heart:  Regular rate and rhythm; no murmurs, clicks, rubs, or gallops. Abdomen:  Normal bowel sounds. Soft, non-tender and mildly distended without masses, hepatosplenomegaly or hernias noted.  No guarding or rebound tenderness.   Rectal: Not performed Msk:  Symmetrical without gross deformities. Good, equal movement & strength bilaterally. Pulses:  Normal pulses noted. Extremities:  No clubbing or edema.  No cyanosis. Neurologic:  Alert and oriented x3;  grossly normal neurologically. Skin: Nontender, nodular, quarter size, bump on her left lower shin just above the ankle joint.  It is mobile.  She said she had this for few weeks, it was tender initially.  No jaundice. Psych:  Alert and cooperative. Normal mood and affect.  Imaging Studies: Reviewed  Assessment and Plan:   Kara Harrell is a 48 y.o. pleasant Caucasian female with history of intermittent heartburn, dysphagia seen in consultation for 2 recurrent episodes of severe lower abdominal pain, colitis on the CT scan  Lower abdominal pain, colitis on the CT scan Recommend colonoscopy to evaluate for inflammatory bowel disease  Suggested her to start stool softener, high-fiber diet to alleviate constipation  Dysphagia, heartburn Recommend EGD for further evaluation   Follow up in 3 to 4 weeks   Cephas Darby, MD

## 2019-01-21 ENCOUNTER — Other Ambulatory Visit: Payer: Self-pay

## 2019-01-21 ENCOUNTER — Other Ambulatory Visit
Admission: RE | Admit: 2019-01-21 | Discharge: 2019-01-21 | Disposition: A | Discharge status: Home / Self Care | Payer: 59 | Source: Ambulatory Visit | Attending: Gastroenterology | Admitting: Gastroenterology

## 2019-01-21 DIAGNOSIS — K5190 Ulcerative colitis, unspecified, without complications: Secondary | ICD-10-CM | POA: Diagnosis not present

## 2019-01-21 DIAGNOSIS — R1310 Dysphagia, unspecified: Secondary | ICD-10-CM | POA: Diagnosis not present

## 2019-01-21 DIAGNOSIS — K219 Gastro-esophageal reflux disease without esophagitis: Secondary | ICD-10-CM | POA: Insufficient documentation

## 2019-01-21 DIAGNOSIS — K259 Gastric ulcer, unspecified as acute or chronic, without hemorrhage or perforation: Secondary | ICD-10-CM | POA: Insufficient documentation

## 2019-01-21 DIAGNOSIS — Z20828 Contact with and (suspected) exposure to other viral communicable diseases: Secondary | ICD-10-CM | POA: Diagnosis not present

## 2019-01-21 DIAGNOSIS — Z01812 Encounter for preprocedural laboratory examination: Secondary | ICD-10-CM | POA: Diagnosis present

## 2019-01-21 LAB — SARS CORONAVIRUS 2 (TAT 6-24 HRS): SARS Coronavirus 2: NEGATIVE

## 2019-01-24 ENCOUNTER — Ambulatory Visit
Admission: RE | Admit: 2019-01-24 | Discharge: 2019-01-24 | Disposition: A | Discharge status: Home / Self Care | Payer: 59 | Attending: Gastroenterology | Admitting: Gastroenterology

## 2019-01-24 ENCOUNTER — Ambulatory Visit: Payer: 59 | Admitting: Anesthesiology

## 2019-01-24 ENCOUNTER — Other Ambulatory Visit: Payer: Self-pay

## 2019-01-24 ENCOUNTER — Encounter
Admission: RE | Disposition: A | Discharge status: Home / Self Care | Payer: Self-pay | Source: Home / Self Care | Attending: Gastroenterology

## 2019-01-24 ENCOUNTER — Encounter: Payer: Self-pay | Admitting: Anesthesiology

## 2019-01-24 DIAGNOSIS — Z87891 Personal history of nicotine dependence: Secondary | ICD-10-CM | POA: Insufficient documentation

## 2019-01-24 DIAGNOSIS — K3189 Other diseases of stomach and duodenum: Secondary | ICD-10-CM

## 2019-01-24 DIAGNOSIS — R12 Heartburn: Secondary | ICD-10-CM | POA: Diagnosis not present

## 2019-01-24 DIAGNOSIS — F419 Anxiety disorder, unspecified: Secondary | ICD-10-CM | POA: Insufficient documentation

## 2019-01-24 DIAGNOSIS — K219 Gastro-esophageal reflux disease without esophagitis: Secondary | ICD-10-CM

## 2019-01-24 DIAGNOSIS — R1310 Dysphagia, unspecified: Secondary | ICD-10-CM | POA: Diagnosis not present

## 2019-01-24 DIAGNOSIS — K644 Residual hemorrhoidal skin tags: Secondary | ICD-10-CM | POA: Insufficient documentation

## 2019-01-24 DIAGNOSIS — R933 Abnormal findings on diagnostic imaging of other parts of digestive tract: Secondary | ICD-10-CM | POA: Diagnosis present

## 2019-01-24 DIAGNOSIS — Z79899 Other long term (current) drug therapy: Secondary | ICD-10-CM | POA: Diagnosis not present

## 2019-01-24 DIAGNOSIS — K2950 Unspecified chronic gastritis without bleeding: Secondary | ICD-10-CM | POA: Diagnosis not present

## 2019-01-24 DIAGNOSIS — F329 Major depressive disorder, single episode, unspecified: Secondary | ICD-10-CM | POA: Insufficient documentation

## 2019-01-24 DIAGNOSIS — K51919 Ulcerative colitis, unspecified with unspecified complications: Secondary | ICD-10-CM

## 2019-01-24 DIAGNOSIS — R1314 Dysphagia, pharyngoesophageal phase: Secondary | ICD-10-CM

## 2019-01-24 HISTORY — PX: ESOPHAGOGASTRODUODENOSCOPY (EGD) WITH PROPOFOL: SHX5813

## 2019-01-24 HISTORY — PX: COLONOSCOPY WITH PROPOFOL: SHX5780

## 2019-01-24 LAB — POCT PREGNANCY, URINE: Preg Test, Ur: NEGATIVE

## 2019-01-24 SURGERY — ESOPHAGOGASTRODUODENOSCOPY (EGD) WITH PROPOFOL
Anesthesia: General

## 2019-01-24 MED ORDER — LACTATED RINGERS IV SOLN
INTRAVENOUS | Status: DC | PRN
  Administered 2019-01-24: 11:00:00 via INTRAVENOUS

## 2019-01-24 MED ORDER — PROPOFOL 500 MG/50ML IV EMUL
INTRAVENOUS | Status: DC | PRN
  Administered 2019-01-24: 175 ug/kg/min via INTRAVENOUS

## 2019-01-24 MED ORDER — OMEPRAZOLE 40 MG PO CPDR: 40.0000 mg | DELAYED_RELEASE_CAPSULE | Freq: Every day | ORAL | 2 refills | Status: DC

## 2019-01-24 MED ORDER — SODIUM CHLORIDE 0.9 % IV SOLN
INTRAVENOUS | Status: DC
  Administered 2019-01-24: 10:00:00 via INTRAVENOUS

## 2019-01-24 MED ORDER — LIDOCAINE HCL (PF) 2 % IJ SOLN
INTRAMUSCULAR | Status: DC | PRN
  Administered 2019-01-24: 100 mg via INTRADERMAL

## 2019-01-24 MED ORDER — PROPOFOL 10 MG/ML IV BOLUS
INTRAVENOUS | Status: DC | PRN
  Administered 2019-01-24: 20 mg via INTRAVENOUS
  Administered 2019-01-24 (×2): 40 mg via INTRAVENOUS
  Administered 2019-01-24: 80 mg via INTRAVENOUS
  Administered 2019-01-24: 30 mg via INTRAVENOUS

## 2019-01-24 NOTE — Anesthesia Preprocedure Evaluation (Addendum)
Anesthesia Evaluation  Patient identified by MRN, date of birth, ID band Patient awake    Reviewed: Allergy & Precautions, NPO status , Patient's Chart, lab work & pertinent test results  History of Anesthesia Complications Negative for: history of anesthetic complications  Airway Mallampati: II  TM Distance: >3 FB Neck ROM: full    Dental  (+) Dental Advidsory Given, Caps, Teeth Intact, Missing   Pulmonary neg shortness of breath, neg COPD, neg recent URI, former smoker,    Pulmonary exam normal        Cardiovascular Exercise Tolerance: Good (-) hypertension(-) angina(-) Cardiac Stents Normal cardiovascular exam(-) dysrhythmias (-) Valvular Problems/Murmurs     Neuro/Psych PSYCHIATRIC DISORDERS Anxiety Depression negative neurological ROS     GI/Hepatic Neg liver ROS, GERD  ,  Endo/Other  negative endocrine ROS  Renal/GU negative Renal ROS     Musculoskeletal   Abdominal   Peds  Hematology   Anesthesia Other Findings Past Medical History: No date: Anxiety No date: Depression   Reproductive/Obstetrics                            Anesthesia Physical  Anesthesia Plan  ASA: II  Anesthesia Plan: General   Post-op Pain Management:    Induction: Intravenous  PONV Risk Score and Plan: 3 and Propofol infusion and TIVA  Airway Management Planned: Natural Airway and Nasal Cannula  Additional Equipment:   Intra-op Plan:   Post-operative Plan:   Informed Consent: I have reviewed the patients History and Physical, chart, labs and discussed the procedure including the risks, benefits and alternatives for the proposed anesthesia with the patient or authorized representative who has indicated his/her understanding and acceptance.       Plan Discussed with: CRNA  Anesthesia Plan Comments:         Anesthesia Quick Evaluation

## 2019-01-24 NOTE — Op Note (Signed)
[facility] Gastroenterology Patient Name: Kara Harrell Procedure Date: 01/24/2019 10:48 AM MRN: [id number] Account #: [id number] Date of Birth: 12-09-1970 Admit Type: Outpatient Age: 48 Room: [facility] ENDO ROOM 1 Gender: Female Note Status: Finalized Procedure:            Upper GI endoscopy Indications:          Esophageal dysphagia, Heartburn Providers:            Lin Landsman MD, MD Medicines:            Monitored Anesthesia Care Complications:        No immediate complications. Estimated blood loss: None. Procedure:            Pre-Anesthesia Assessment:                       - Prior to the procedure, a History and Physical was                        performed, and patient medications and allergies were                        reviewed. The patient is competent. The risks and                        benefits of the procedure and the sedation options and                        risks were discussed with the patient. All questions                        were answered and informed consent was obtained.                        Patient identification and proposed procedure were                        verified by the physician, the nurse, the                        anesthesiologist, the anesthetist and the technician in                        the pre-procedure area in the procedure room in the                        endoscopy suite. Mental Status Examination: alert and                        oriented. Airway Examination: normal oropharyngeal                        airway and neck mobility. Respiratory Examination:                        clear to auscultation. CV Examination: normal.                        Prophylactic Antibiotics: The patient does not require  prophylactic antibiotics. Prior Anticoagulants: The                        patient has taken no previous anticoagulant or                        antiplatelet agents. ASA Grade Assessment:  II - A                        patient with mild systemic disease. After reviewing the                        risks and benefits, the patient was deemed in                        satisfactory condition to undergo the procedure. The                        anesthesia plan was to use monitored anesthesia care                        (MAC). Immediately prior to administration of                        medications, the patient was re-assessed for adequacy                        to receive sedatives. The heart rate, respiratory rate,                        oxygen saturations, blood pressure, adequacy of                        pulmonary ventilation, and response to care were                        monitored throughout the procedure. The physical status                        of the patient was re-assessed after the procedure.                       After obtaining informed consent, the endoscope was                        passed under direct vision. Throughout the procedure,                        the patient's blood pressure, pulse, and oxygen                        saturations were monitored continuously. The Endoscope                        was introduced through the mouth, and advanced to the                        second part of duodenum. The upper GI endoscopy was  accomplished without difficulty. The patient tolerated                        the procedure fairly well. Findings:      The duodenal bulb and second portion of the duodenum were normal.      Multiple dispersed, diminutive non-bleeding erosions were found in the       gastric body, at the incisura and in the gastric antrum. There were no       stigmata of recent bleeding. Biopsies were taken with a cold forceps for       Helicobacter pylori testing.      The cardia and gastric fundus were normal on retroflexion.      Esophagogastric landmarks were identified: the gastroesophageal junction       was found at 35  cm from the incisors.      The gastroesophageal junction and examined esophagus were normal.       Biopsies were taken with a cold forceps for histology. Impression:           - Normal duodenal bulb and second portion of the                        duodenum.                       - Non-bleeding erosive gastropathy. Biopsied.                       - Esophagogastric landmarks identified.                       - Normal gastroesophageal junction and esophagus.                        Biopsied. Recommendation:       - Await pathology results.                       - Follow an antireflux regimen.                       - Use Prilosec (omeprazole) 40 mg PO daily for 4 weeks.                       - Return to my office as previously scheduled. Procedure Code(s):    --- Professional ---                       [phone], Esophagogastroduodenoscopy, flexible, transoral;                        with biopsy, single or multiple Diagnosis Code(s):    --- Professional ---                       K31.89, Other diseases of stomach and duodenum                       R13.14, Dysphagia, pharyngoesophageal phase                       R12, Heartburn CPT copyright 2019 American Medical Association. All rights reserved. The codes documented in this report are preliminary and upon coder review may  be revised to meet current compliance requirements. Dr. Ulyess Mort Lin Landsman MD, MD 01/24/2019 11:33:12 AM This report has been signed electronically. Number of Addenda: 0 Note Initiated On: 01/24/2019 10:48 AM Estimated Blood Loss: Estimated blood loss was minimal.      [facility]

## 2019-01-24 NOTE — Anesthesia Post-op Follow-up Note (Signed)
Anesthesia QCDR form completed.        

## 2019-01-24 NOTE — Anesthesia Procedure Notes (Signed)
Date/Time: 01/24/2019 11:38 AM Performed by: Nelda Marseille, CRNA Pre-anesthesia Checklist: Patient identified, Emergency Drugs available, Suction available, Patient being monitored and Timeout performed Oxygen Delivery Method: Nasal cannula

## 2019-01-24 NOTE — Transfer of Care (Signed)
Immediate Anesthesia Transfer of Care Note  Patient: Kara Harrell  Procedure(s) Performed: ESOPHAGOGASTRODUODENOSCOPY (EGD) WITH PROPOFOL (N/A ) COLONOSCOPY WITH PROPOFOL (N/A )  Patient Location: PACU  Anesthesia Type:General  Level of Consciousness: awake and sedated  Airway & Oxygen Therapy: Patient Spontanous Breathing and Patient connected to nasal cannula oxygen  Post-op Assessment: Report given to RN and Post -op Vital signs reviewed and stable  Post vital signs: Reviewed and stable  Last Vitals:  Vitals Value Taken Time  BP 108/76 01/24/19 1155  Temp 36.1 C 01/24/19 1154  Pulse 92 01/24/19 1156  Resp 15 01/24/19 1156  SpO2 100 % 01/24/19 1156  Vitals shown include unvalidated device data.  Last Pain:  Vitals:   01/24/19 1154  TempSrc: Tympanic  PainSc: 0-No pain         Complications: No apparent anesthesia complications

## 2019-01-24 NOTE — Op Note (Signed)
[facility] Gastroenterology Patient Name: Kara Harrell Procedure Date: 01/24/2019 11:11 AM MRN: [id number] Account #: [id number] Date of Birth: 03/10/1971 Admit Type: Outpatient Age: 48 Room: [facility] ENDO ROOM 1 Gender: Female Note Status: Finalized Procedure:            Colonoscopy Indications:          Abnormal CT of the GI tract Providers:            Lin Landsman MD, MD Medicines:            Monitored Anesthesia Care Complications:        No immediate complications. Estimated blood loss: None. Procedure:            Pre-Anesthesia Assessment:                       - Prior to the procedure, a History and Physical was                        performed, and patient medications and allergies were                        reviewed. The patient is competent. The risks and                        benefits of the procedure and the sedation options and                        risks were discussed with the patient. All questions                        were answered and informed consent was obtained.                        Patient identification and proposed procedure were                        verified by the physician, the nurse, the                        anesthesiologist, the anesthetist and the technician in                        the pre-procedure area in the procedure room in the                        endoscopy suite. Mental Status Examination: alert and                        oriented. Airway Examination: normal oropharyngeal                        airway and neck mobility. Respiratory Examination:                        clear to auscultation. CV Examination: normal.                        Prophylactic Antibiotics: The patient does not require  prophylactic antibiotics. Prior Anticoagulants: The                        patient has taken no previous anticoagulant or                        antiplatelet agents. ASA Grade Assessment: II - A                     patient with mild systemic disease. After reviewing the                        risks and benefits, the patient was deemed in                        satisfactory condition to undergo the procedure. The                        anesthesia plan was to use monitored anesthesia care                        (MAC). Immediately prior to administration of                        medications, the patient was re-assessed for adequacy                        to receive sedatives. The heart rate, respiratory rate,                        oxygen saturations, blood pressure, adequacy of                        pulmonary ventilation, and response to care were                        monitored throughout the procedure. The physical status                        of the patient was re-assessed after the procedure.                       After obtaining informed consent, the colonoscope was                        passed under direct vision. Throughout the procedure,                        the patient's blood pressure, pulse, and oxygen                        saturations were monitored continuously. The                        Colonoscope was introduced through the anus and                        advanced to the the terminal ileum, with identification                        of the appendiceal  orifice and IC valve. The                        colonoscopy was performed without difficulty. The                        patient tolerated the procedure well. The quality of                        the bowel preparation was evaluated using the BBPS                        [facility] Bowel Preparation Scale) with scores of: Right                        Colon = 3, Transverse Colon = 3 and Left Colon = 3                        (entire mucosa seen well with no residual staining,                        small fragments of stool or opaque liquid). The total                        BBPS score equals 9. Findings:      The  perianal and digital rectal examinations were normal. Pertinent       negatives include normal sphincter tone and no palpable rectal lesions.      The terminal ileum appeared normal.      The entire examined colon appeared normal.      Non-bleeding external hemorrhoids were found during retroflexion. The       hemorrhoids were small. Impression:           - The examined portion of the ileum was normal.                       - The entire examined colon is normal.                       - Non-bleeding external hemorrhoids.                       - No specimens collected. Recommendation:       - Discharge patient to home (with spouse).                       - Resume previous diet today.                       - Continue present medications.                       - Return to my office as previously scheduled. Procedure Code(s):    --- Professional ---                       [phone], Colonoscopy, flexible; diagnostic, including                        collection of specimen(s) by brushing or washing, when  performed (separate procedure) Diagnosis Code(s):    --- Professional ---                       K64.4, Residual hemorrhoidal skin tags                       R93.3, Abnormal findings on diagnostic imaging of other                        parts of digestive tract CPT copyright 2019 American Medical Association. All rights reserved. The codes documented in this report are preliminary and upon coder review may  be revised to meet current compliance requirements. Dr. Ulyess Mort Lin Landsman MD, MD 01/24/2019 11:50:46 AM This report has been signed electronically. Number of Addenda: 0 Note Initiated On: 01/24/2019 11:11 AM Scope Withdrawal Time: 0 hours 7 minutes 53 seconds  Total Procedure Duration: 0 hours 12 minutes 22 seconds  Estimated Blood Loss: Estimated blood loss: none.      [facility]

## 2019-01-24 NOTE — Anesthesia Postprocedure Evaluation (Signed)
Anesthesia Post Note  Patient: Kara Harrell  Procedure(s) Performed: ESOPHAGOGASTRODUODENOSCOPY (EGD) WITH PROPOFOL (N/A ) COLONOSCOPY WITH PROPOFOL (N/A )  Patient location during evaluation: Endoscopy Anesthesia Type: General Level of consciousness: awake and alert Pain management: pain level controlled Vital Signs Assessment: post-procedure vital signs reviewed and stable Respiratory status: spontaneous breathing, nonlabored ventilation, respiratory function stable and patient connected to nasal cannula oxygen Cardiovascular status: blood pressure returned to baseline and stable Postop Assessment: no apparent nausea or vomiting Anesthetic complications: no     Last Vitals:  Vitals:   01/24/19 1004 01/24/19 1154  BP: 118/87 108/76  Pulse: 98   Resp: 15 16  Temp: (!) 36.1 C (!) 36.1 C  SpO2: 98%     Last Pain:  Vitals:   01/24/19 1154  TempSrc: Tympanic  PainSc: 0-No pain                 Martha Clan
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
## 2019-01-28 LAB — SURGICAL PATHOLOGY

## 2019-02-05 ENCOUNTER — Encounter: Payer: Self-pay | Admitting: Family Medicine

## 2019-02-05 ENCOUNTER — Other Ambulatory Visit: Payer: Self-pay

## 2019-02-05 ENCOUNTER — Ambulatory Visit (INDEPENDENT_AMBULATORY_CARE_PROVIDER_SITE_OTHER): Payer: 59 | Admitting: Family Medicine

## 2019-02-05 VITALS — BP 106/70 | HR 95 | Temp 96.8°F | Resp 16 | Ht 62.0 in | Wt 122.0 lb

## 2019-02-05 DIAGNOSIS — M5410 Radiculopathy, site unspecified: Secondary | ICD-10-CM

## 2019-02-05 MED ORDER — TRAMADOL HCL 50 MG PO TABS: 50.0000 mg | ORAL_TABLET | Freq: Three times a day (TID) | ORAL | 0 refills | Status: AC | PRN

## 2019-02-05 MED ORDER — PREDNISONE 10 MG PO TABS: 10.0000 mg | ORAL_TABLET | ORAL | 0 refills | Status: DC

## 2019-02-05 MED ORDER — GABAPENTIN 100 MG PO CAPS: 100.0000 mg | ORAL_CAPSULE | Freq: Three times a day (TID) | ORAL | 0 refills | Status: DC
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
## 2019-02-12 ENCOUNTER — Other Ambulatory Visit: Payer: Self-pay | Admitting: Physical Medicine and Rehabilitation

## 2019-02-12 DIAGNOSIS — M5416 Radiculopathy, lumbar region: Secondary | ICD-10-CM

## 2019-02-20 ENCOUNTER — Encounter (INDEPENDENT_AMBULATORY_CARE_PROVIDER_SITE_OTHER): Payer: Self-pay

## 2019-02-20 ENCOUNTER — Encounter: Payer: Self-pay | Admitting: Gastroenterology

## 2019-02-20 ENCOUNTER — Other Ambulatory Visit: Payer: Self-pay

## 2019-02-20 ENCOUNTER — Ambulatory Visit (INDEPENDENT_AMBULATORY_CARE_PROVIDER_SITE_OTHER): Payer: 59 | Admitting: Gastroenterology

## 2019-02-20 VITALS — BP 127/81 | HR 88 | Temp 97.8°F | Wt 123.2 lb

## 2019-02-20 DIAGNOSIS — K200 Eosinophilic esophagitis: Secondary | ICD-10-CM

## 2019-02-20 MED ORDER — OMEPRAZOLE 40 MG PO CPDR: 40.0000 mg | DELAYED_RELEASE_CAPSULE | Freq: Two times a day (BID) | ORAL | 2 refills | Status: DC

## 2019-02-20 NOTE — Progress Notes (Signed)
Kara Darby, MD [address]  [place]  [place], [place] [address]  Main: [phone]  Fax: [phone]    Gastroenterology Consultation  Referring Provider:     Steele Sizer, MD Primary Care Physician:  Steele Sizer, MD Primary Gastroenterologist:  Dr. Cephas Harrell Reason for Consultation:   Eosinophilic esophagitis        HPI:   Kara Harrell is a 48 y.o. female referred by Dr. Steele Sizer, MD  for consultation & management of lower abdominal pain, abnormal CT colon, dysphagia and heartburn  Lower abdominal pain and colitis on CT scan: Patient went to ER on 6/24 secondary to severe lower abdominal pain radiating to lower back.  Her labs were unremarkable, CT abdomen and pelvis with contrast revealed thickening of the transverse and descending colon.  She was told to make changes in her diet, follow a low fiber, low residue.  Her symptoms resolved.  She had another attack last week, had severe lower abdominal, pelvic pain and radiating to the low back and constipation for 3 days.  She went to ER, found to have mild leukocytosis only.  She had repeat labs 24 hours later which were unremarkable.  She is referred to GI for further evaluation.  She had one episode of diarrhea yesterday, feels bloated.  She denies rectal bleeding.  She is not on any antibiotics.  She denies nausea or vomiting.  She reports subjective fevers.  She was off work for about 1 and half weeks due to these episodes.  She is a Theme park manager.  She denies any weight loss.  Denies prior episodes.  She drinks alcohol about 5 drinks per week.  She does not smoke.  She denies family history of IBD.  She does report tingling and numbness in her bilateral toes, purplish discoloration of her toenails. She reports history of endometriosis.  Not menopausal  Dysphagia, intermittent.  She has sensation of feeling stuck in her upper chest, intermittent heartburn.  She underwent EGD in 2017, found to have mild  stenosis which was dilated by Dr. Allen Norris  Follow-up visit 07/22/2018 Patient underwent EGD and confirmed the diagnosis of eosinophilic esophagitis.  She is currently on omeprazole 40 mg daily before breakfast only.  She reports that her dysphagia symptoms are significantly improved.  She does not have any other concerns today.  She denies lower abdominal pain.  Colonoscopy was unremarkable for inflammatory bowel disease   NSAIDs: None  Antiplts/Anticoagulants/Anti thrombotics: None  GI Procedures:  EGD 10/18/2015 - Benign-appearing esophageal stenosis. Dilated. - Gastritis. Biopsied. - Normal examined duodenum.  EGD and colonoscopy 01/24/19 - Normal duodenal bulb and second portion of the duodenum. - Non-bleeding erosive gastropathy. Biopsied. - Esophagogastric landmarks identified. - Normal gastroesophageal junction and esophagus. Biopsied.  DIAGNOSIS:  A. STOMACH, RANDOM; COLD BIOPSY:  - CHRONIC AND FOCALLY ACTIVE GASTRITIS.  - NEGATIVE FOR HELICOBACTER PYLORI.  - NEGATIVE FOR INTESTINAL METAPLASIA, DYSPLASIA, AND MALIGNANCY.   B. ESOPHAGUS, RANDOM; COLD BIOPSY:  - SQUAMOUS MUCOSA WITH INCREASED EOSINOPHILS (25 PER HPF).  - NEGATIVE FOR INTESTINAL METAPLASIA, DYSPLASIA, AND MALIGNANCY.  - SEE COMMENT.   Comment:  In the proper clinical context, the findings would be consistent with a  diagnosis of eosinophilic esophagitis.  - The examined portion of the ileum was normal. - The entire examined colon is normal. - Non-bleeding external hemorrhoids. - No specimens collected.    Past Medical History:  Diagnosis Date  . Anxiety   . Depression  Past Surgical History:  Procedure Laterality Date  . CESAREAN SECTION    . COLONOSCOPY WITH PROPOFOL N/A 01/24/2019   Procedure: COLONOSCOPY WITH PROPOFOL;  Surgeon: Lin Landsman, MD;  Location: [facility] ENDOSCOPY;  Service: Gastroenterology;  Laterality: N/A;  . Cyst Removed from ovary    . ESOPHAGOGASTRODUODENOSCOPY (EGD)  WITH PROPOFOL N/A 10/18/2015   Procedure: ESOPHAGOGASTRODUODENOSCOPY (EGD) WITH PROPOFOL with dialation;  Surgeon: Lucilla Lame, MD;  Location: [facility];  Service: Endoscopy;  Laterality: N/A;  . ESOPHAGOGASTRODUODENOSCOPY (EGD) WITH PROPOFOL N/A 01/24/2019   Procedure: ESOPHAGOGASTRODUODENOSCOPY (EGD) WITH PROPOFOL;  Surgeon: Lin Landsman, MD;  Location: [facility] ENDOSCOPY;  Service: Gastroenterology;  Laterality: N/A;  . FOOT SURGERY      Current Outpatient Medications:  .  dicyclomine (BENTYL) 20 MG tablet, Take 1 tablet (20 mg total) by mouth 3 (three) times daily as needed., Disp: 30 tablet, Rfl: 0 .  DULoxetine (CYMBALTA) 60 MG capsule, Take 1 capsule (60 mg total) by mouth daily., Disp: 90 capsule, Rfl: 0 .  gabapentin (NEURONTIN) 100 MG capsule, Take 1 capsule (100 mg total) by mouth 3 (three) times daily., Disp: 90 capsule, Rfl: 0 .  hydrOXYzine (ATARAX/VISTARIL) 50 MG tablet, Take 1 tablet (50 mg total) by mouth at bedtime., Disp: 90 tablet, Rfl: 0 .  omeprazole (PRILOSEC) 40 MG capsule, Take 1 capsule (40 mg total) by mouth 2 (two) times daily before a meal., Disp: 60 capsule, Rfl: 2    Family History  Problem Relation Age of Onset  . Cancer Mother        Lung  . Diabetes Sister   . Breast cancer Neg Hx      Social History   Tobacco Use  . Smoking status: Former Smoker    Packs/day: 0.50    Years: 20.00    Pack years: 10.00    Types: Cigarettes    Quit date: 10/24/2016    Years since quitting: 2.3  . Smokeless tobacco: Never Used  . Tobacco comment: smokes socially, when having a drink  Substance Use Topics  . Alcohol use: Yes    Alcohol/week: 5.0 standard drinks    Types: 5 Cans of beer per week  . Drug use: No    Allergies as of 02/20/2019  . (No Known Allergies)    Review of Systems:    All systems reviewed and negative except where noted in HPI.   Physical Exam:  BP 127/81 (BP Location: Left Arm, Patient Position: Sitting, Cuff Size: Normal)    Pulse 88   Temp 97.8 F (36.6 C) (Oral)   Wt 123 lb 4 oz (55.9 kg)   BMI 22.54 kg/m  No LMP recorded. Patient is premenopausal.  General:   Alert,  Well-developed, well-nourished, pleasant and cooperative in NAD Head:  Normocephalic and atraumatic. Eyes:  Sclera clear, no icterus.   Conjunctiva pink. Ears:  Normal auditory acuity. Nose:  No deformity, discharge, or lesions. Mouth:  No deformity or lesions,oropharynx pink & moist. Neck:  Supple; no masses or thyromegaly. Lungs:  Respirations even and unlabored.  Clear throughout to auscultation.   No wheezes, crackles, or rhonchi. No acute distress. Heart:  Regular rate and rhythm; no murmurs, clicks, rubs, or gallops. Abdomen:  Normal bowel sounds. Soft, non-tender and mildly distended without masses, hepatosplenomegaly or hernias noted.  No guarding or rebound tenderness.   Rectal: Not performed Msk:  Symmetrical without gross deformities. Good, equal movement & strength bilaterally. Pulses:  Normal pulses noted. Extremities:  No clubbing or  edema.  No cyanosis. Neurologic:  Alert and oriented x3;  grossly normal neurologically. Skin: Nontender, nodular, quarter size, bump on her left lower shin just above the ankle joint.  It is mobile.  She said she had this for few weeks, it was tender initially.  No jaundice. Psych:  Alert and cooperative. Normal mood and affect.  Imaging Studies: Reviewed  Assessment and Plan:   Kara Harrell is a 48 y.o. pleasant Caucasian female with history of intermittent heartburn, dysphagia seen for follow-up of dysphagia.  EGD confirmed eosinophilic esophagitis  Eosinophilic esophagitis Increase omeprazole 40 mg to 2 times daily before meals Discussed in detail with patient about her condition, its consequences if left untreated, long-term management including dual of steroids, 6 food elimination diet if needed and follow-up endoscopy to confirm response to the treatment Recommend repeat upper  endoscopy with esophageal biopsies in 3 months   Follow up in 3 to 4 months   Kara Darby, MD

## 2019-02-20 NOTE — Patient Instructions (Signed)
For thosewho opt for dietary management, we suggest an initial trial of afour-food(milk, soy, egg, wheat) empiric elimination diet, althoughmilk-only eliminationis a reasonable alternative for management of eosinophilic esophagitis

## 2019-02-24 ENCOUNTER — Other Ambulatory Visit: Payer: Self-pay

## 2019-02-24 ENCOUNTER — Ambulatory Visit
Admission: RE | Admit: 2019-02-24 | Discharge: 2019-02-24 | Disposition: A | Discharge status: Home / Self Care | Payer: 59 | Source: Ambulatory Visit | Attending: Physical Medicine and Rehabilitation | Admitting: Physical Medicine and Rehabilitation

## 2019-02-24 DIAGNOSIS — M5416 Radiculopathy, lumbar region: Secondary | ICD-10-CM | POA: Insufficient documentation

## 2019-03-28 ENCOUNTER — Telehealth: Payer: Self-pay | Admitting: Family Medicine

## 2019-03-28 DIAGNOSIS — M5410 Radiculopathy, site unspecified: Secondary | ICD-10-CM

## 2019-03-28 DIAGNOSIS — F331 Major depressive disorder, recurrent, moderate: Secondary | ICD-10-CM

## 2019-03-28 MED ORDER — GABAPENTIN 100 MG PO CAPS: 100.0000 mg | ORAL_CAPSULE | Freq: Three times a day (TID) | ORAL | 0 refills | Status: DC

## 2019-03-28 MED ORDER — DULOXETINE HCL 60 MG PO CPEP: 60.0000 mg | ORAL_CAPSULE | Freq: Every day | ORAL | 0 refills | Status: DC

## 2019-03-28 NOTE — Telephone Encounter (Signed)
Medication refill: DULoxetine (CYMBALTA) 60 MG capsule [[id number]] gabapentin (NEURONTIN) 100 MG capsule [[id number]]      Pharmacy:  [facility], [place] [phone] (Phone) [phone] (Fax)

## 2019-03-28 NOTE — Telephone Encounter (Signed)
Pt is in the grocery store and will call back when she is home

## 2019-03-31 ENCOUNTER — Telehealth: Payer: Self-pay

## 2019-03-31 NOTE — Telephone Encounter (Signed)
Patient is calling because patient states she does not think she needs the EGD scheduled for 05/05/2019. She states she wants to cancel the procedure because she feels fine now and is able to eat. Asked patient if she wanted to rescheduled this patient declined to rescheduled the procedure. Called ENDO and trish said she would call me back in a few minutes.

## 2019-03-31 NOTE — Telephone Encounter (Signed)
Informed ENDO of this information

## 2019-04-29 ENCOUNTER — Ambulatory Visit (INDEPENDENT_AMBULATORY_CARE_PROVIDER_SITE_OTHER): Payer: 59 | Admitting: Family Medicine

## 2019-04-29 ENCOUNTER — Encounter: Payer: Self-pay | Admitting: Family Medicine

## 2019-04-29 ENCOUNTER — Other Ambulatory Visit: Payer: Self-pay

## 2019-04-29 VITALS — BP 136/78 | HR 102 | Temp 96.8°F | Resp 16 | Ht 62.0 in | Wt 121.1 lb

## 2019-04-29 DIAGNOSIS — M51369 Other intervertebral disc degeneration, lumbar region without mention of lumbar back pain or lower extremity pain: Secondary | ICD-10-CM

## 2019-04-29 DIAGNOSIS — L509 Urticaria, unspecified: Secondary | ICD-10-CM | POA: Diagnosis not present

## 2019-04-29 DIAGNOSIS — F330 Major depressive disorder, recurrent, mild: Secondary | ICD-10-CM | POA: Diagnosis not present

## 2019-04-29 DIAGNOSIS — M5136 Other intervertebral disc degeneration, lumbar region: Secondary | ICD-10-CM

## 2019-04-29 DIAGNOSIS — F419 Anxiety disorder, unspecified: Secondary | ICD-10-CM

## 2019-04-29 DIAGNOSIS — I700 Atherosclerosis of aorta: Secondary | ICD-10-CM

## 2019-04-29 DIAGNOSIS — Z23 Encounter for immunization: Secondary | ICD-10-CM

## 2019-04-29 DIAGNOSIS — K2930 Chronic superficial gastritis without bleeding: Secondary | ICD-10-CM

## 2019-04-29 DIAGNOSIS — K200 Eosinophilic esophagitis: Secondary | ICD-10-CM

## 2019-04-29 DIAGNOSIS — R030 Elevated blood-pressure reading, without diagnosis of hypertension: Secondary | ICD-10-CM

## 2019-04-29 DIAGNOSIS — F5104 Psychophysiologic insomnia: Secondary | ICD-10-CM

## 2019-04-29 DIAGNOSIS — M47816 Spondylosis without myelopathy or radiculopathy, lumbar region: Secondary | ICD-10-CM

## 2019-04-29 DIAGNOSIS — I10 Essential (primary) hypertension: Secondary | ICD-10-CM | POA: Insufficient documentation

## 2019-04-29 MED ORDER — GABAPENTIN 300 MG PO CAPS: 300.0000 mg | ORAL_CAPSULE | Freq: Three times a day (TID) | ORAL | 1 refills | Status: DC

## 2019-04-29 MED ORDER — OMEPRAZOLE 40 MG PO CPDR: 40.0000 mg | DELAYED_RELEASE_CAPSULE | Freq: Every day | ORAL | 1 refills | Status: DC

## 2019-04-29 MED ORDER — DULOXETINE HCL 60 MG PO CPEP: 60.0000 mg | ORAL_CAPSULE | Freq: Every day | ORAL | 1 refills | Status: DC

## 2019-04-29 MED ORDER — HYDROXYZINE HCL 50 MG PO TABS: 50.0000 mg | ORAL_TABLET | Freq: Every day | ORAL | 0 refills | Status: DC
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
## 2019-05-01 ENCOUNTER — Encounter: Payer: Self-pay | Admitting: Family Medicine

## 2019-05-01 DIAGNOSIS — D0472 Carcinoma in situ of skin of left lower limb, including hip: Secondary | ICD-10-CM | POA: Insufficient documentation

## 2019-05-02 ENCOUNTER — Encounter: Payer: Self-pay | Admitting: Family Medicine

## 2019-05-05 ENCOUNTER — Ambulatory Visit: Admit: 2019-05-05 | Payer: 59 | Admitting: Gastroenterology

## 2019-05-05 SURGERY — ESOPHAGOGASTRODUODENOSCOPY (EGD) WITH PROPOFOL
Anesthesia: General

## 2019-07-14 ENCOUNTER — Ambulatory Visit (INDEPENDENT_AMBULATORY_CARE_PROVIDER_SITE_OTHER): Payer: 59 | Admitting: Family Medicine

## 2019-07-14 ENCOUNTER — Encounter: Payer: Self-pay | Admitting: Family Medicine

## 2019-07-14 ENCOUNTER — Other Ambulatory Visit: Payer: Self-pay

## 2019-07-14 ENCOUNTER — Other Ambulatory Visit (HOSPITAL_COMMUNITY)
Admission: RE | Admit: 2019-07-14 | Discharge: 2019-07-14 | Disposition: A | Discharge status: Home / Self Care | Payer: 59 | Source: Ambulatory Visit | Attending: Family Medicine | Admitting: Family Medicine

## 2019-07-14 VITALS — BP 136/82 | HR 96 | Temp 96.8°F | Resp 16 | Ht 62.0 in | Wt 124.8 lb

## 2019-07-14 DIAGNOSIS — Z13220 Encounter for screening for lipoid disorders: Secondary | ICD-10-CM

## 2019-07-14 DIAGNOSIS — Z131 Encounter for screening for diabetes mellitus: Secondary | ICD-10-CM

## 2019-07-14 DIAGNOSIS — Z124 Encounter for screening for malignant neoplasm of cervix: Secondary | ICD-10-CM | POA: Diagnosis present

## 2019-07-14 DIAGNOSIS — Z1231 Encounter for screening mammogram for malignant neoplasm of breast: Secondary | ICD-10-CM | POA: Diagnosis not present

## 2019-07-14 DIAGNOSIS — Z1159 Encounter for screening for other viral diseases: Secondary | ICD-10-CM

## 2019-07-14 DIAGNOSIS — Z0000 Encounter for general adult medical examination without abnormal findings: Secondary | ICD-10-CM | POA: Insufficient documentation
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
## 2019-07-14 NOTE — Patient Instructions (Signed)
Preventive Care 2-51 Years Old, Female Preventive care refers to visits with your health care provider and lifestyle choices that can promote health and wellness. This includes:  A yearly physical exam. This may also be called an annual well check.  Regular dental visits and eye exams.  Immunizations.  Screening for certain conditions.  Healthy lifestyle choices, such as eating a healthy diet, getting regular exercise, not using drugs or products that contain nicotine and tobacco, and limiting alcohol use. What can I expect for my preventive care visit? Physical exam Your health care provider will check your:  Height and weight. This may be used to calculate body mass index (BMI), which tells if you are at a healthy weight.  Heart rate and blood pressure.  Skin for abnormal spots. Counseling Your health care provider may ask you questions about your:  Alcohol, tobacco, and drug use.  Emotional well-being.  Home and relationship well-being.  Sexual activity.  Eating habits.  Work and work Statistician.  Method of birth control.  Menstrual cycle.  Pregnancy history. What immunizations do I need?  Influenza (flu) vaccine  This is recommended every year. Tetanus, diphtheria, and pertussis (Tdap) vaccine  You may need a Td booster every 10 years. Varicella (chickenpox) vaccine  You may need this if you have not been vaccinated. Zoster (shingles) vaccine  You may need this after age 10. Measles, mumps, and rubella (MMR) vaccine  You may need at least one dose of MMR if you were born in 1957 or later. You may also need a second dose. Pneumococcal conjugate (PCV13) vaccine  You may need this if you have certain conditions and were not previously vaccinated. Pneumococcal polysaccharide (PPSV23) vaccine  You may need one or two doses if you smoke cigarettes or if you have certain conditions. Meningococcal conjugate (MenACWY) vaccine  You may need this if you  have certain conditions. Hepatitis A vaccine  You may need this if you have certain conditions or if you travel or work in places where you may be exposed to hepatitis A. Hepatitis B vaccine  You may need this if you have certain conditions or if you travel or work in places where you may be exposed to hepatitis B. Haemophilus influenzae type b (Hib) vaccine  You may need this if you have certain conditions. Human papillomavirus (HPV) vaccine  If recommended by your health care provider, you may need three doses over 6 months. You may receive vaccines as individual doses or as more than one vaccine together in one shot (combination vaccines). Talk with your health care provider about the risks and benefits of combination vaccines. What tests do I need? Blood tests  Lipid and cholesterol levels. These may be checked every 5 years, or more frequently if you are over 80 years old.  Hepatitis C test.  Hepatitis B test. Screening  Lung cancer screening. You may have this screening every year starting at age 11 if you have a 30-pack-year history of smoking and currently smoke or have quit within the past 15 years.  Colorectal cancer screening. All adults should have this screening starting at age 58 and continuing until age 37. Your health care provider may recommend screening at age 23 if you are at increased risk. You will have tests every 1-10 years, depending on your results and the type of screening test.  Diabetes screening. This is done by checking your blood sugar (glucose) after you have not eaten for a while (fasting). You may have this  done every 1-3 years.  Mammogram. This may be done every 1-2 years. Talk with your health care provider about when you should start having regular mammograms. This may depend on whether you have a family history of breast cancer.  BRCA-related cancer screening. This may be done if you have a family history of breast, ovarian, tubal, or peritoneal  cancers.  Pelvic exam and Pap test. This may be done every 3 years starting at age 107. Starting at age 75, this may be done every 5 years if you have a Pap test in combination with an HPV test. Other tests  Sexually transmitted disease (STD) testing.  Bone density scan. This is done to screen for osteoporosis. You may have this scan if you are at high risk for osteoporosis. Follow these instructions at home: Eating and drinking  Eat a diet that includes fresh fruits and vegetables, whole grains, lean protein, and low-fat dairy.  Take vitamin and mineral supplements as recommended by your health care provider.  Do not drink alcohol if: ? Your health care provider tells you not to drink. ? You are pregnant, may be pregnant, or are planning to become pregnant.  If you drink alcohol: ? Limit how much you have to 0-1 drink a day. ? Be aware of how much alcohol is in your drink. In the [place] one drink equals one 12 oz bottle of beer (355 mL), one 5 oz glass of wine (148 mL), or one 1 oz glass of hard liquor (44 mL). Lifestyle  Take daily care of your teeth and gums.  Stay active. Exercise for at least 30 minutes on 5 or more days each week.  Do not use any products that contain nicotine or tobacco, such as cigarettes, e-cigarettes, and chewing tobacco. If you need help quitting, ask your health care provider.  If you are sexually active, practice safe sex. Use a condom or other form of birth control (contraception) in order to prevent pregnancy and STIs (sexually transmitted infections).  If told by your health care provider, take low-dose aspirin daily starting at age 46. What's next?  Visit your health care provider once a year for a well check visit.  Ask your health care provider how often you should have your eyes and teeth checked.  Stay up to date on all vaccines. This information is not intended to replace advice given to you by your health care provider. Make sure you  discuss any questions you have with your health care provider. Document Revised: 01/24/2018 Document Reviewed: 01/24/2018 Elsevier Patient Education  2020 [employer].

## 2019-07-15 ENCOUNTER — Other Ambulatory Visit: Payer: Self-pay | Admitting: Family Medicine

## 2019-07-15 DIAGNOSIS — Z1231 Encounter for screening mammogram for malignant neoplasm of breast: Secondary | ICD-10-CM

## 2019-07-15 LAB — HEMOGLOBIN A1C
Hgb A1c MFr Bld: 5 % of total Hgb (ref <5.7)
Mean Plasma Glucose: 97 (calc)
eAG (mmol/L): 5.4 (calc)

## 2019-07-15 LAB — HEPATITIS C ANTIBODY
Hepatitis C Ab: NONREACTIVE
SIGNAL TO CUT-OFF: 0.02 (ref <1.00)

## 2019-07-15 LAB — LIPID PANEL
Cholesterol: 207 mg/dL — ABNORMAL HIGH (ref <200)
HDL: 71 mg/dL (ref >=50)
LDL Cholesterol (Calc): 95 mg/dL (calc)
Non-HDL Cholesterol (Calc): 136 mg/dL (calc) — ABNORMAL HIGH (ref <130)
Total CHOL/HDL Ratio: 2.9 (calc) (ref <5.0)
Triglycerides: 289 mg/dL — ABNORMAL HIGH (ref <150)

## 2019-07-16 LAB — CERVICOVAGINAL ANCILLARY ONLY
Comment: NEGATIVE
High risk HPV: NEGATIVE

## 2019-07-18 NOTE — Addendum Note (Signed)
Addended by: Steele Sizer F on: 07/18/2019 05:01 PM   Modules accepted: Orders

## 2019-07-18 NOTE — Addendum Note (Signed)
Addended by: Inda Coke on: 07/18/2019 10:57 AM   Modules accepted: Orders

## 2019-07-21 LAB — CYTOLOGY - PAP: Diagnosis: NEGATIVE

## 2019-08-14 ENCOUNTER — Ambulatory Visit
Admission: RE | Admit: 2019-08-14 | Discharge: 2019-08-14 | Disposition: A | Discharge status: Home / Self Care | Payer: 59 | Source: Ambulatory Visit | Attending: Family Medicine | Admitting: Family Medicine

## 2019-08-14 DIAGNOSIS — Z1231 Encounter for screening mammogram for malignant neoplasm of breast: Secondary | ICD-10-CM | POA: Insufficient documentation

## 2019-09-09 ENCOUNTER — Other Ambulatory Visit: Payer: Self-pay | Admitting: Family Medicine

## 2019-09-09 DIAGNOSIS — F419 Anxiety disorder, unspecified: Secondary | ICD-10-CM

## 2019-09-09 DIAGNOSIS — F5104 Psychophysiologic insomnia: Secondary | ICD-10-CM

## 2019-09-09 DIAGNOSIS — L509 Urticaria, unspecified: Secondary | ICD-10-CM

## 2019-09-09 MED ORDER — HYDROXYZINE HCL 50 MG PO TABS: 50.0000 mg | ORAL_TABLET | Freq: Every day | ORAL | 0 refills | Status: DC

## 2019-09-09 NOTE — Telephone Encounter (Signed)
Copied from [place] [id number]. Topic: Quick Communication - Rx Refill/Question >> Sep 09, 2019 11:27 AM Rainey Pines A wrote: Medication:hydrOXYzine (ATARAX/VISTARIL) 50 MG tablet (Patient stated her medications were called in recently but this medication left out.)  Has the patient contacted their pharmacy? Yes (Agent: If no, request that the patient contact the pharmacy for the refill.) (Agent: If yes, when and what did the pharmacy advise?)Contact PCP  Preferred Pharmacy (with phone number or street name): [place], [place]  Phone:  [phone] Fax:  [phone]     Agent: Please be advised that RX refills may take up to 3 business days. We ask that you follow-up with your pharmacy.

## 2019-09-24 ENCOUNTER — Ambulatory Visit
Admission: EM | Admit: 2019-09-24 | Discharge: 2019-09-24 | Disposition: A | Discharge status: Home / Self Care | Payer: 59 | Attending: Family Medicine | Admitting: Family Medicine

## 2019-09-24 ENCOUNTER — Ambulatory Visit (INDEPENDENT_AMBULATORY_CARE_PROVIDER_SITE_OTHER): Payer: 59

## 2019-09-24 ENCOUNTER — Other Ambulatory Visit: Payer: Self-pay

## 2019-09-24 DIAGNOSIS — S299XXA Unspecified injury of thorax, initial encounter: Secondary | ICD-10-CM | POA: Diagnosis not present

## 2019-09-24 DIAGNOSIS — M545 Low back pain, unspecified: Secondary | ICD-10-CM

## 2019-09-24 MED ORDER — HYDROCODONE-ACETAMINOPHEN 5-325 MG PO TABS: 1.0000 | ORAL_TABLET | Freq: Three times a day (TID) | ORAL | 0 refills | Status: DC | PRN

## 2019-09-24 NOTE — ED Provider Notes (Signed)
[facility]    CSN: [id number] Arrival date & time: 09/24/19  1002      History   Chief Complaint Chief Complaint  Patient presents with  . Rib Injury    right   HPI  49 year old female presents with the above complaint.  Patient states that she was at a wedding on Saturday.  She states that her sister-in-law was sitting in a chair that broke.  Sister-in-law began falling and reached out and pulled her to the ground.  In doing so, patient landed on hard sand.  She reports right-sided rib pain and back pain.  Her pain is severe.  Worse with activity and coughing as well as deep breathing.  She has used ice and ibuprofen without relief.  Pain is severe, 10/10 in severity.  No relieving factors.  No other reported symptoms.  No other complaints.  Past Medical History:  Diagnosis Date  . Anxiety   . Depression     Patient Active Problem List   Diagnosis Date Noted  . 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 Hypertension, benign 04/29/2019  . Eosinophilic esophagitis 69/67/8938  . Moderate single current episode of major depressive disorder ([place]) 07/30/2018  . Stricture and stenosis of esophagus   . Dysphagia 08/18/2015    Past Surgical History:  Procedure Laterality Date  . CESAREAN SECTION    . COLONOSCOPY WITH PROPOFOL N/A 01/24/2019   Procedure: COLONOSCOPY WITH PROPOFOL;  Surgeon: Lin Landsman, MD;  Location: [facility] ENDOSCOPY;  Service: Gastroenterology;  Laterality: N/A;  . Cyst Removed from ovary    . ESOPHAGOGASTRODUODENOSCOPY (EGD) WITH PROPOFOL N/A 10/18/2015   Procedure: ESOPHAGOGASTRODUODENOSCOPY (EGD) WITH PROPOFOL with dialation;  Surgeon: Lucilla Lame, MD;  Location: [place];  Service: Endoscopy;  Laterality: N/A;  . ESOPHAGOGASTRODUODENOSCOPY (EGD) WITH PROPOFOL N/A 01/24/2019   Procedure: ESOPHAGOGASTRODUODENOSCOPY (EGD) WITH PROPOFOL;  Surgeon: Lin Landsman, MD;  Location: [facility]  ENDOSCOPY;  Service: Gastroenterology;  Laterality: N/A;  . FOOT SURGERY      OB History   No obstetric history on file.      Home Medications    Prior to Admission medications   Medication Sig Start Date End Date Taking? Authorizing Provider  DULoxetine (CYMBALTA) 60 MG capsule Take 1 capsule (60 mg total) by mouth daily. 04/29/19  Yes Sowles, Drue Stager, MD  fluoruracil (FLUOROPLEX) 1 % cream With calcipotriene 0.005% 07/02/19  Yes Isenstein, Arin L, MD  gabapentin (NEURONTIN) 300 MG capsule Take 1 capsule (300 mg total) by mouth 3 (three) times daily. 04/29/19  Yes Sowles, Drue Stager, MD  hydrOXYzine (ATARAX/VISTARIL) 50 MG tablet Take 1 tablet (50 mg total) by mouth at bedtime. 09/09/19  Yes Sowles, Drue Stager, MD  omeprazole (PRILOSEC) 40 MG capsule Take 1 capsule (40 mg total) by mouth daily. 04/29/19 09/24/19 Yes Sowles, Drue Stager, MD  HYDROcodone-acetaminophen (NORCO/VICODIN) 5-325 MG tablet Take 1 tablet by mouth every 8 (eight) hours as needed. 09/24/19   Coral Spikes, DO  dicyclomine (BENTYL) 20 MG tablet Take 1 tablet (20 mg total) by mouth 3 (three) times daily as needed. Patient not taking: Reported on 04/29/2019 12/27/18 09/24/19  Steele Sizer, MD    Family History Family History  Problem Relation Age of Onset  . Cancer Mother        Lung  . Diabetes Sister   . Breast cancer Neg Hx     Social History Social History   Tobacco Use  . Smoking status: Former  Smoker    Packs/day: 0.50    Years: 20.00    Pack years: 10.00    Types: Cigarettes    Quit date: 10/24/2016    Years since quitting: 2.9  . Smokeless tobacco: Never Used  . Tobacco comment: smokes socially, when having a drink  Substance Use Topics  . Alcohol use: Yes    Alcohol/week: 5.0 standard drinks    Types: 5 Cans of beer per week  . Drug use: No     Allergies   Patient has no known allergies.   Review of Systems Review of Systems  Constitutional: Negative.   Musculoskeletal:       Right sided rib  pain, back pain.   Physical Exam Triage Vital Signs ED Triage Vitals  Enc Vitals Group     BP 09/24/19 1022 108/72     Pulse Rate 09/24/19 1022 (!) 102     Resp 09/24/19 1022 18     Temp 09/24/19 1022 98.5 F (36.9 C)     Temp Source 09/24/19 1022 Oral     SpO2 09/24/19 1022 97 %     Weight 09/24/19 1020 116 lb (52.6 kg)     Height 09/24/19 1020 5' 3"  (1.6 m)     Head Circumference --      Peak Flow --      Pain Score 09/24/19 1020 10     Pain Loc --      Pain Edu? --      Excl. in [place]? --    Updated Vital Signs BP 108/72 (BP Location: Left Arm)   Pulse (!) 102   Temp 98.5 F (36.9 C) (Oral)   Resp 18   Ht 5' 3"  (1.6 m)   Wt 52.6 kg   SpO2 97%   BMI 20.55 kg/m   Visual Acuity Right Eye Distance:   Left Eye Distance:   Bilateral Distance:    Right Eye Near:   Left Eye Near:    Bilateral Near:     Physical Exam Vitals and nursing note reviewed.  Constitutional:      General: She is not in acute distress.    Appearance: Normal appearance. She is not ill-appearing.  HENT:     Head: Normocephalic and atraumatic.  Eyes:     General:        Right eye: No discharge.        Left eye: No discharge.     Conjunctiva/sclera: Conjunctivae normal.  Cardiovascular:     Rate and Rhythm: Regular rhythm. Tachycardia present.  Pulmonary:     Effort: Pulmonary effort is normal.     Breath sounds: No wheezing, rhonchi or rales.  Musculoskeletal:     Comments: Right anterior/lateral rib tenderness to palpation.  Neurological:     Mental Status: She is alert.  Psychiatric:        Mood and Affect: Mood normal.        Behavior: Behavior normal.    UC Treatments / Results  Labs (all labs ordered are listed, but only abnormal results are displayed) Labs Reviewed - No data to display  EKG   Radiology DG Ribs Unilateral W/Chest Right  Result Date: 09/24/2019 CLINICAL DATA:  Right rib pain after injury several days ago EXAM: RIGHT RIBS AND CHEST - 3+ VIEW COMPARISON:   July 08, 2015. FINDINGS: No fracture or other bone lesions are seen involving the ribs. There is no evidence of pneumothorax or pleural effusion. Both lungs are clear. Heart size  and mediastinal contours are within normal limits. IMPRESSION: Negative. Electronically Signed   By: Marijo Conception M.D.   On: 09/24/2019 11:16    Procedures Procedures (including critical care time)  Medications Ordered in UC Medications - No data to display  Initial Impression / Assessment and Plan / UC Course  I have reviewed the triage vital signs and the nursing notes.  Pertinent labs & imaging results that were available during my care of the patient were reviewed by me and considered in my medical decision making (see chart for details).    49 year old female presents with back pain and rib pain.  X-ray obtained today and did not reveal a rib fracture.  Treating pain with short course of Vicodin.  Advised rest and ice.  Supportive care.  Final Clinical Impressions(s) / UC Diagnoses   Final diagnoses:  Rib injury  Acute bilateral low back pain without sciatica     Discharge Instructions     Rest.   Ice.  Pain medication as directed.  Take care  Dr. Lacinda Axon     ED Prescriptions    Medication Sig Dispense Auth. Provider   HYDROcodone-acetaminophen (NORCO/VICODIN) 5-325 MG tablet Take 1 tablet by mouth every 8 (eight) hours as needed. 10 tablet Thersa Salt G, DO     I have reviewed the PDMP during this encounter.   Coral Spikes, DO 09/24/19 1322

## 2019-09-24 NOTE — ED Triage Notes (Signed)
Patient complains of right sided rib pain that occurred when she was pulled down on Saturday by her sister who was falling. Patient states that she landed on her back in the sand and has been having right sided rib pain since. Patient reports that she has been using ice and pressure but has been unable to get relief.

## 2019-09-24 NOTE — Discharge Instructions (Signed)
Rest.   Ice.  Pain medication as directed.  Take care  Dr. Lacinda Axon

## 2019-10-13 ENCOUNTER — Ambulatory Visit: Payer: 59 | Admitting: Family Medicine

## 2019-10-29 ENCOUNTER — Other Ambulatory Visit: Payer: Self-pay | Admitting: Family Medicine

## 2019-10-29 DIAGNOSIS — M47816 Spondylosis without myelopathy or radiculopathy, lumbar region: Secondary | ICD-10-CM

## 2019-10-29 DIAGNOSIS — M5136 Other intervertebral disc degeneration, lumbar region: Secondary | ICD-10-CM

## 2019-10-29 MED ORDER — GABAPENTIN 300 MG PO CAPS: 300.0000 mg | ORAL_CAPSULE | Freq: Three times a day (TID) | ORAL | 1 refills | Status: DC

## 2019-10-29 NOTE — Telephone Encounter (Signed)
Copied from [place] [id number]. Topic: Quick Communication - Rx Refill/Question >> Oct 29, 2019  8:37 AM Rainey Pines A wrote: Medication: gabapentin (NEURONTIN) 300 MG capsule   Has the patient contacted their pharmacy? yes (Agent: If no, request that the patient contact the pharmacy for the refill.) (Agent: If yes, when and what did the pharmacy advise?)Contact PCP  Preferred Pharmacy (with phone number or street name): [place], [place]  Phone:  [phone] Fax:  [phone]     Agent: Please be advised that RX refills may take up to 3 business days. We ask that you follow-up with your pharmacy.

## 2019-11-14 ENCOUNTER — Other Ambulatory Visit: Payer: Self-pay | Admitting: Family Medicine

## 2019-11-14 DIAGNOSIS — F330 Major depressive disorder, recurrent, mild: Secondary | ICD-10-CM

## 2019-11-14 DIAGNOSIS — K200 Eosinophilic esophagitis: Secondary | ICD-10-CM

## 2019-11-14 DIAGNOSIS — F419 Anxiety disorder, unspecified: Secondary | ICD-10-CM

## 2019-11-14 MED ORDER — DULOXETINE HCL 60 MG PO CPEP: 60.0000 mg | ORAL_CAPSULE | Freq: Every day | ORAL | 1 refills | Status: DC

## 2019-11-14 MED ORDER — OMEPRAZOLE 40 MG PO CPDR: 40.0000 mg | DELAYED_RELEASE_CAPSULE | Freq: Every day | ORAL | 1 refills | Status: DC

## 2019-11-14 NOTE — Telephone Encounter (Signed)
Patient requesting DULoxetine (CYMBALTA) 60 MG capsule and omeprazole (PRILOSEC) 40 MG capsule informed please allow 48 to 72 hour turn around time. Patient states she's out.    [place], [place] Phone:  [phone]  Fax:  [phone]

## 2020-01-01 ENCOUNTER — Other Ambulatory Visit: Payer: Self-pay | Admitting: Family Medicine

## 2020-01-01 DIAGNOSIS — L509 Urticaria, unspecified: Secondary | ICD-10-CM

## 2020-01-01 DIAGNOSIS — F5104 Psychophysiologic insomnia: Secondary | ICD-10-CM

## 2020-01-01 DIAGNOSIS — F419 Anxiety disorder, unspecified: Secondary | ICD-10-CM

## 2020-01-01 MED ORDER — HYDROXYZINE HCL 50 MG PO TABS: 50.0000 mg | ORAL_TABLET | Freq: Every day | ORAL | 0 refills | Status: DC

## 2020-01-01 NOTE — Telephone Encounter (Signed)
Copied from [place] [id number]. Topic: Quick Communication - Rx Refill/Question >> Jan 01, 2020  9:24 AM Yvette Rack wrote: Medication: hydrOXYzine (ATARAX/VISTARIL) 50 MG tablet  Has the patient contacted their pharmacy? no  Preferred Pharmacy (with phone number or street name): [place], [place]  Phone: [phone]  Fax: [phone]  Agent: Please be advised that RX refills may take up to 3 business days. We ask that you follow-up with your pharmacy.

## 2020-01-08 ENCOUNTER — Telehealth: Payer: 59 | Admitting: Internal Medicine

## 2020-01-09 ENCOUNTER — Ambulatory Visit
Admission: EM | Admit: 2020-01-09 | Discharge: 2020-01-09 | Disposition: A | Discharge status: Home / Self Care | Payer: 59 | Attending: Emergency Medicine | Admitting: Emergency Medicine

## 2020-01-09 DIAGNOSIS — J069 Acute upper respiratory infection, unspecified: Secondary | ICD-10-CM | POA: Insufficient documentation

## 2020-01-09 MED ORDER — BENZONATATE 100 MG PO CAPS
100.0000 mg | ORAL_CAPSULE | Freq: Three times a day (TID) | ORAL | 0 refills | Status: DC | PRN
Start: 2020-01-09 — End: 2020-09-13

## 2020-01-09 NOTE — Discharge Instructions (Signed)
Your rapid strep test is negative.  A throat culture is pending; we will call you if it is positive requiring treatment.    Your COVID test is pending.  You should self quarantine until the test result is back.    Take Tylenol as needed for fever or discomfort.  Rest and keep yourself hydrated.    Go to the emergency department if you develop acute worsening symptoms.

## 2020-01-09 NOTE — ED Provider Notes (Signed)
[facility]    CSN: [id number] Arrival date & time: 01/09/20  1251      History   Chief Complaint Chief Complaint  Patient presents with  . Sinus Problem  . Otalgia  . Nasal Congestion  . Nasal drainage  . Fever  . Chills  . Sore Throat    HPI Kara Harrell is a 49 y.o. female.  Patient presents with postnasal drip, sinus congestion, sore throat, nonproductive cough, subjective fever, chills, earaches x3 days. Treatment attempted at home with ibuprofen and Mucinex. Patient has not received COVID vaccines. She denies shortness of breath, abdominal pain, vomiting, unusual diarrhea (patient states she has this at baseline), rash, or other symptoms.    The history is provided by the patient.    Past Medical History:  Diagnosis Date  . Anxiety   . Depression     Patient Active Problem List   Diagnosis Date Noted  . 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 Hypertension, benign 04/29/2019  . Eosinophilic esophagitis 74/25/9563  . Moderate single current episode of major depressive disorder ([place]) 07/30/2018  . Stricture and stenosis of esophagus   . Dysphagia 08/18/2015    Past Surgical History:  Procedure Laterality Date  . CESAREAN SECTION    . COLONOSCOPY WITH PROPOFOL N/A 01/24/2019   Procedure: COLONOSCOPY WITH PROPOFOL;  Surgeon: Lin Landsman, MD;  Location: [facility] ENDOSCOPY;  Service: Gastroenterology;  Laterality: N/A;  . Cyst Removed from ovary    . ESOPHAGOGASTRODUODENOSCOPY (EGD) WITH PROPOFOL N/A 10/18/2015   Procedure: ESOPHAGOGASTRODUODENOSCOPY (EGD) WITH PROPOFOL with dialation;  Surgeon: Lucilla Lame, MD;  Location: [place];  Service: Endoscopy;  Laterality: N/A;  . ESOPHAGOGASTRODUODENOSCOPY (EGD) WITH PROPOFOL N/A 01/24/2019   Procedure: ESOPHAGOGASTRODUODENOSCOPY (EGD) WITH PROPOFOL;  Surgeon: Lin Landsman, MD;  Location: [facility] ENDOSCOPY;  Service: Gastroenterology;  Laterality: N/A;   . FOOT SURGERY      OB History   No obstetric history on file.      Home Medications    Prior to Admission medications   Medication Sig Start Date End Date Taking? Authorizing Provider  benzonatate (TESSALON) 100 MG capsule Take 1 capsule (100 mg total) by mouth 3 (three) times daily as needed for cough. 01/09/20   Sharion Balloon, NP  DULoxetine (CYMBALTA) 60 MG capsule Take 1 capsule (60 mg total) by mouth daily. 11/14/19   Steele Sizer, MD  fluoruracil (FLUOROPLEX) 1 % cream With calcipotriene 0.005% 07/02/19   Isenstein, Arin L, MD  gabapentin (NEURONTIN) 300 MG capsule Take 1 capsule (300 mg total) by mouth 3 (three) times daily. 10/29/19   Steele Sizer, MD  HYDROcodone-acetaminophen (NORCO/VICODIN) 5-325 MG tablet Take 1 tablet by mouth every 8 (eight) hours as needed. 09/24/19   Coral Spikes, DO  hydrOXYzine (ATARAX/VISTARIL) 50 MG tablet Take 1 tablet (50 mg total) by mouth at bedtime. 01/01/20   Steele Sizer, MD  omeprazole (PRILOSEC) 40 MG capsule Take 1 capsule (40 mg total) by mouth daily. 11/14/19 12/14/19  Steele Sizer, MD  dicyclomine (BENTYL) 20 MG tablet Take 1 tablet (20 mg total) by mouth 3 (three) times daily as needed. Patient not taking: Reported on 04/29/2019 12/27/18 09/24/19  Steele Sizer, MD    Family History Family History  Problem Relation Age of Onset  . Cancer Mother        Lung  . Diabetes Sister   . Breast cancer Neg Hx     Social  History Social History   Tobacco Use  . Smoking status: Former Smoker    Packs/day: 0.50    Years: 20.00    Pack years: 10.00    Types: Cigarettes    Quit date: 10/24/2016    Years since quitting: 3.2  . Smokeless tobacco: Never Used  . Tobacco comment: smokes socially, when having a drink  Vaping Use  . Vaping Use: Former  Substance Use Topics  . Alcohol use: Yes    Alcohol/week: 5.0 standard drinks    Types: 5 Cans of beer per week  . Drug use: No     Allergies   Patient has no known  allergies.   Review of Systems Review of Systems  Constitutional: Positive for chills and fever.  HENT: Positive for congestion, ear pain, postnasal drip, rhinorrhea and sore throat.   Eyes: Negative for pain and visual disturbance.  Respiratory: Negative for cough and shortness of breath.   Cardiovascular: Negative for chest pain and palpitations.  Gastrointestinal: Negative for abdominal pain, nausea and vomiting.  Genitourinary: Negative for dysuria and hematuria.  Musculoskeletal: Negative for arthralgias and back pain.  Skin: Negative for color change and rash.  Neurological: Negative for seizures and syncope.  All other systems reviewed and are negative.    Physical Exam Triage Vital Signs ED Triage Vitals  Enc Vitals Group     BP 01/09/20 1257 (!) 149/94     Pulse Rate 01/09/20 1257 79     Resp 01/09/20 1257 16     Temp 01/09/20 1257 99.3 F (37.4 C)     Temp src --      SpO2 01/09/20 1257 98 %     Weight --      Height --      Head Circumference --      Peak Flow --      Pain Score 01/09/20 1253 4     Pain Loc --      Pain Edu? --      Excl. in [place]? --    No data found.  Updated Vital Signs BP (!) 149/94   Pulse 79   Temp 99.3 F (37.4 C)   Resp 16   SpO2 98%   Visual Acuity Right Eye Distance:   Left Eye Distance:   Bilateral Distance:    Right Eye Near:   Left Eye Near:    Bilateral Near:     Physical Exam Vitals and nursing note reviewed.  Constitutional:      General: She is not in acute distress.    Appearance: She is well-developed. She is not ill-appearing.  HENT:     Head: Normocephalic and atraumatic.     Right Ear: Tympanic membrane and ear canal normal.     Left Ear: Tympanic membrane and ear canal normal.     Nose: Rhinorrhea present.     Mouth/Throat:     Mouth: Mucous membranes are moist.     Pharynx: Oropharynx is clear.  Eyes:     Conjunctiva/sclera: Conjunctivae normal.  Cardiovascular:     Rate and Rhythm: Normal rate  and regular rhythm.     Heart sounds: Normal heart sounds. No murmur heard.   Pulmonary:     Effort: Pulmonary effort is normal. No respiratory distress.     Breath sounds: Normal breath sounds. No wheezing or rhonchi.  Abdominal:     Palpations: Abdomen is soft.     Tenderness: There is no abdominal tenderness. There is no guarding  or rebound.  Musculoskeletal:     Cervical back: Neck supple.  Skin:    General: Skin is warm and dry.     Findings: No rash.  Neurological:     General: No focal deficit present.     Mental Status: She is alert and oriented to person, place, and time.     Gait: Gait normal.  Psychiatric:        Mood and Affect: Mood normal.        Behavior: Behavior normal.      UC Treatments / Results  Labs (all labs ordered are listed, but only abnormal results are displayed) Labs Reviewed  NOVEL CORONAVIRUS, NAA  CULTURE, GROUP A STREP [facility])  POCT RAPID STREP A (OFFICE)    EKG   Radiology No results found.  Procedures Procedures (including critical care time)  Medications Ordered in UC Medications - No data to display  Initial Impression / Assessment and Plan / UC Course  I have reviewed the triage vital signs and the nursing notes.  Pertinent labs & imaging results that were available during my care of the patient were reviewed by me and considered in my medical decision making (see chart for details).  URI.  Rapid strep negative; culture pending.  PCR COVID pending.  Instructed patient to self quarantine until the test result is back.  Treating cough with Tessalon Per[place].  Discussed with patient that she can take Tylenol as needed for fever or discomfort.  Instructed patient to go to the emergency department if she develops high fever, shortness of breath, severe diarrhea, or other concerning symptoms.  Patient agrees with plan of care.     Final Clinical Impressions(s) / UC Diagnoses   Final diagnoses:  Upper respiratory tract infection,  unspecified type     Discharge Instructions     Your rapid strep test is negative.  A throat culture is pending; we will call you if it is positive requiring treatment.    Your COVID test is pending.  You should self quarantine until the test result is back.    Take Tylenol as needed for fever or discomfort.  Rest and keep yourself hydrated.    Go to the emergency department if you develop acute worsening symptoms.        ED Prescriptions    Medication Sig Dispense Auth. Provider   benzonatate (TESSALON) 100 MG capsule Take 1 capsule (100 mg total) by mouth 3 (three) times daily as needed for cough. 21 capsule Sharion Balloon, NP     PDMP not reviewed this encounter.   Sharion Balloon, NP 01/09/20 1326

## 2020-01-09 NOTE — ED Triage Notes (Signed)
Patient reports sinus pressure, sore throat, nasal drainage, fever, chills, and bilateral ear pain since Wednesday.

## 2020-01-11 LAB — CULTURE, GROUP A STREP (THRC)

## 2020-01-11 LAB — NOVEL CORONAVIRUS, NAA: SARS-CoV-2, NAA: NOT DETECTED

## 2020-01-11 LAB — SARS-COV-2, NAA 2 DAY TAT

## 2020-04-19 ENCOUNTER — Other Ambulatory Visit: Payer: Self-pay | Admitting: Family Medicine

## 2020-04-19 DIAGNOSIS — F5104 Psychophysiologic insomnia: Secondary | ICD-10-CM

## 2020-04-19 DIAGNOSIS — L509 Urticaria, unspecified: Secondary | ICD-10-CM

## 2020-04-19 DIAGNOSIS — F419 Anxiety disorder, unspecified: Secondary | ICD-10-CM

## 2020-04-19 MED ORDER — HYDROXYZINE HCL 50 MG PO TABS: 50.0000 mg | ORAL_TABLET | Freq: Every day | ORAL | 0 refills | Status: DC

## 2020-04-19 NOTE — Telephone Encounter (Signed)
Medication Refill - Medication: Hydroxyzine 50 mg  Has the patient contacted their pharmacy? No. (Agent: If no, request that the patient contact the pharmacy for the refill.) (Agent: If yes, when and what did the pharmacy advise?)  Preferred Pharmacy (with phone number or street name): [facility]  Agent: Please be advised that RX refills may take up to 3 business days. We ask that you follow-up with your pharmacy.

## 2020-05-13 ENCOUNTER — Other Ambulatory Visit: Payer: Self-pay | Admitting: Family Medicine

## 2020-05-13 DIAGNOSIS — M47816 Spondylosis without myelopathy or radiculopathy, lumbar region: Secondary | ICD-10-CM

## 2020-05-13 DIAGNOSIS — M5136 Other intervertebral disc degeneration, lumbar region: Secondary | ICD-10-CM

## 2020-05-13 NOTE — Telephone Encounter (Signed)
Medication Refill - Medication: Gabapentin 300 mg   Has the patient contacted their pharmacy? No. (Agent: If no, request that the patient contact the pharmacy for the refill.) (Agent: If yes, when and what did the pharmacy advise?)  Preferred Pharmacy (with phone number or street name): [facility]  Agent: Please be advised that RX refills may take up to 3 business days. We ask that you follow-up with your pharmacy.

## 2020-05-14 MED ORDER — GABAPENTIN 300 MG PO CAPS: 300.0000 mg | ORAL_CAPSULE | Freq: Three times a day (TID) | ORAL | 1 refills | Status: DC

## 2020-05-14 NOTE — Telephone Encounter (Signed)
Pt called about the status of her request for a refill of Gabapentin / please advise  And send to [facility]

## 2020-05-14 NOTE — Addendum Note (Signed)
Addended by: Dimple Nanas on: 05/14/2020 04:39 PM   Modules accepted: Orders

## 2020-06-09 ENCOUNTER — Telehealth (INDEPENDENT_AMBULATORY_CARE_PROVIDER_SITE_OTHER): Payer: HRSA Program | Admitting: Internal Medicine

## 2020-06-09 DIAGNOSIS — U071 COVID-19: Secondary | ICD-10-CM | POA: Diagnosis not present

## 2020-06-09 DIAGNOSIS — R059 Cough, unspecified: Secondary | ICD-10-CM

## 2020-06-09 MED ORDER — ALBUTEROL SULFATE HFA 108 (90 BASE) MCG/ACT IN AERS: 2.0000 | INHALATION_SPRAY | Freq: Four times a day (QID) | RESPIRATORY_TRACT | 0 refills | Status: DC | PRN

## 2020-06-09 MED ORDER — PREDNISONE 10 MG PO TABS: ORAL_TABLET | ORAL | 0 refills | Status: DC

## 2020-06-09 NOTE — Progress Notes (Signed)
Name: Kara Harrell   MRN: [id number]    DOB: 04-23-1971   Date:06/09/2020       Progress Note  Subjective  Chief Complaint  Chief Complaint  Patient presents with  . Covid Positive    Feels like getting worse    I connected with  Kara Harrell on 06/09/20 at  1:40 PM EST by telephone and verified that I am speaking with the correct person using two identifiers.  I discussed the limitations, risks, security and privacy concerns of performing an evaluation and management service by telephone and the availability of in person appointments. The patient expressed understanding and agreed to proceed. Staff also discussed with the patient that there may be a patient responsible charge related to this service. Patient Location: Home Provider Location: [facility] Additional Individuals present: none  HPI Patient is a 50 year old female patient of Dr. Ancil Harrell Patient is COVID-positive, with concerns for increasing symptoms A follow-up phone visit was arranged for today.  Patient with Covid, test result was positive last Thursday Sx's started Monday, a week ago, noted at the beginning, she had mostly sinus symptoms, with the cough now more problematic and worried with more chest involvement  Notes symptoms that persist include: + cough, no production, had a little this am and yellowish No marked SOB, minimal, comes and goes + fever, last check 101.,  Thinks her last check was yesterday or the day prior + mild sore throat and congestion, + PND and mucus clear  No loss of smell, no loss of taste but weird, not normal, appetite decreased + N/ no V + muscle aches + marked loose stools - off and on all day yesterday, a couple times so far today No CP, chest is sore, no passing out episodes Has tried taking ibuprofen, alka seltzer plus, mucinex DM since last week  Tobacco-former smoker, quit 2018 Comorbid conditions reviewed  No h/o asthma No h/o DM,  + HTN No CKD     Patient Active  Problem List   Diagnosis Date Noted  . 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 Hypertension, benign 04/29/2019  . Eosinophilic esophagitis 01/77/9390  . Moderate single current episode of major depressive disorder ([place]) 07/30/2018  . Stricture and stenosis of esophagus   . Dysphagia 08/18/2015    Past Surgical History:  Procedure Laterality Date  . CESAREAN SECTION    . COLONOSCOPY WITH PROPOFOL N/A 01/24/2019   Procedure: COLONOSCOPY WITH PROPOFOL;  Surgeon: Lin Landsman, MD;  Location: [facility] ENDOSCOPY;  Service: Gastroenterology;  Laterality: N/A;  . Cyst Removed from ovary    . ESOPHAGOGASTRODUODENOSCOPY (EGD) WITH PROPOFOL N/A 10/18/2015   Procedure: ESOPHAGOGASTRODUODENOSCOPY (EGD) WITH PROPOFOL with dialation;  Surgeon: Lucilla Lame, MD;  Location: [place];  Service: Endoscopy;  Laterality: N/A;  . ESOPHAGOGASTRODUODENOSCOPY (EGD) WITH PROPOFOL N/A 01/24/2019   Procedure: ESOPHAGOGASTRODUODENOSCOPY (EGD) WITH PROPOFOL;  Surgeon: Lin Landsman, MD;  Location: [facility] ENDOSCOPY;  Service: Gastroenterology;  Laterality: N/A;  . FOOT SURGERY      Family History  Problem Relation Age of Onset  . Cancer Mother        Lung  . Diabetes Sister   . Breast cancer Neg Hx     Social History   Tobacco Use  . Smoking status: Former Smoker    Packs/day: 0.50    Years: 20.00    Pack years: 10.00    Types: Cigarettes    Quit date: 10/24/2016  Years since quitting: 3.6  . Smokeless tobacco: Never Used  . Tobacco comment: smokes socially, when having a drink  Substance Use Topics  . Alcohol use: Yes    Alcohol/week: 5.0 standard drinks    Types: 5 Cans of beer per week     Current Outpatient Medications:  .  DULoxetine (CYMBALTA) 60 MG capsule, Take 1 capsule (60 mg total) by mouth daily., Disp: 90 capsule, Rfl: 1 .  gabapentin (NEURONTIN) 300 MG capsule, Take 1 capsule (300 mg total) by mouth 3 (three) times daily.,  Disp: 90 capsule, Rfl: 1 .  hydrOXYzine (ATARAX/VISTARIL) 50 MG tablet, Take 1 tablet (50 mg total) by mouth at bedtime., Disp: 90 tablet, Rfl: 0 .  benzonatate (TESSALON) 100 MG capsule, Take 1 capsule (100 mg total) by mouth 3 (three) times daily as needed for cough. (Patient not taking: Reported on 06/09/2020), Disp: 21 capsule, Rfl: 0 .  fluoruracil (FLUOROPLEX) 1 % cream, With calcipotriene 0.005% (Patient not taking: Reported on 06/09/2020), Disp: , Rfl:  .  HYDROcodone-acetaminophen (NORCO/VICODIN) 5-325 MG tablet, Take 1 tablet by mouth every 8 (eight) hours as needed. (Patient not taking: Reported on 06/09/2020), Disp: 10 tablet, Rfl: 0 .  omeprazole (PRILOSEC) 40 MG capsule, Take 1 capsule (40 mg total) by mouth daily., Disp: 90 capsule, Rfl: 1  No Known Allergies  With staff assistance, above reviewed with the patient today.  ROS: As per HPI, otherwise no specific complaints on a limited and focused system review   Objective  Virtual encounter, vitals not obtained.  There is no height or weight on file to calculate BMI.  Physical Exam   Appears in NAD via conversation, had 1 coughing episode during our conversation Pulmonary/Chest: No obvious respiratory distress. Speaking in complete sentences Neurological: Pt is alert, Speech is normal Psychiatric: Patient has a normal mood and affect, Judgment and thought content normal.   No results found for this or any previous visit (from the past 72 hour(s)).  PHQ2/9: Depression screen [facility] 2/9 06/09/2020 07/14/2019 04/29/2019 02/05/2019 01/10/2019  Decreased Interest 0 0 1 1 0  Down, Depressed, Hopeless 0 3 1 1  0  PHQ - 2 Score 0 3 2 2  0  Altered sleeping - 0 1 1 2   Tired, decreased energy - 0 2 1 1   Change in appetite - 0 0 0 1  Feeling bad or failure about yourself  - 0 0 0 0  Trouble concentrating - 0 0 0 1  Moving slowly or fidgety/restless - 0 0 0 0  Suicidal thoughts - 0 0 0 0  PHQ-9 Score - 3 5 4 5   Difficult doing  work/chores - Not difficult at all Not difficult at all Not difficult at all Somewhat difficult  Some recent data might be hidden   PHQ-2/9 Result reviewed  Fall Risk: Fall Risk  06/09/2020 07/14/2019 04/29/2019 02/05/2019 01/10/2019  Falls in the past year? 0 0 0 0 0  Number falls in past yr: 0 0 0 0 0  Injury with Fall? 0 0 0 0 0  Follow up - - - - Falls evaluation completed     Assessment & Plan  1. COVID 2. Cough Noted the limitations with this being a phone visit  patient with COVID, symptoms starting a week ago Monday, 10 days ago, and she notes her symptoms have increased some and certainly have not improved.  Feeling more cough and chest congestion.  She still has some elevated temperatures, denies higher fevers, denies marked  shortness of breath.  Chest is sore more than having frank chest pains.  Continues to feel achy.  Has been trying to stay well-hydrated drinking a lot of fluids Discussed the treatment options for COVID presently including antibody infusions and 2 oral pills, although do need to be started within 10 days, with her symptoms now present for 10 days. Noted the limited availability of these entities. Also noted respiratory clinics now available to see patients with COVID and having more concerning symptoms, and are Monday Wednesday and Friday evenings, and also an option presently. After our discussion, felt best to proceed as follows: We will add an oral steroid, with prednisone prescribed in a weaning dose Also add an inhaler, albuterol and she has been on this previously.  Recommended taking 3 times a day routinely to help with symptoms and with clearance, and then as symptoms improving over time, just use as needed. Also continue on Mucinex product as an expectorant, and Tylenol products as needed. Also staying well-hydrated important, and relative rest that she has been doing  Also emphasized importance of continuing with close monitoring of symptoms, and if  symptoms not improving or more problematic despite the above measures started today, she is to contact us Friday again and we will arrange to try to get her seen at the respiratory clinic clinic on Friday evening as do feel that is appropriate.  Emphasized if she has more acute worsening of symptoms with increasing shortness of breath, chest pains, higher fevers, she needs to be seen and evaluated more emergently in an ER setting and she was understanding of that.  - predniSONE (DELTASONE) 10 MG tablet; Take four tabs daily X 2 days, then two tabs daily X 2 days, then one tab daily X 2 days  Dispense: 14 tablet; Refill: 0 - albuterol (VENTOLIN HFA) 108 (90 Base) MCG/ACT inhaler; Inhale 2 puffs into the lungs every 6 (six) hours as needed for wheezing or shortness of breath. Can use routinely 3 times a day presently, then return to as needed when symptoms much improved  Dispense: 6.7 g; Refill: 0  Await her response to the above, with close monitoring emphasized, and follow-up as noted above.  Also remaining isolated until her symptoms significantly improved needs to continue.  I discussed the assessment and treatment plan with the patient. The patient was provided an opportunity to ask questions and all were answered. The patient agreed with the plan and demonstrated an understanding of the instructions.  Red flags and when to present for emergency care or RTC including fevers, chest pain, shortness of breath, new/worsening/un-resolving symptoms reviewed with patient at time of visit.   The patient was advised to call back or seek an in-person evaluation if the symptoms worsen or if the condition fails to improve as anticipated.  I provided 20 minutes of non-face-to-face time during this encounter that included discussing at length patient's sx/history, pertinent pmhx, medications, treatment and follow up plan. This time also included the necessary documentation, orders, and chart review.  Towanda Malkin, MD

## 2020-06-28 ENCOUNTER — Other Ambulatory Visit: Payer: Self-pay | Admitting: Family Medicine

## 2020-06-28 DIAGNOSIS — F419 Anxiety disorder, unspecified: Secondary | ICD-10-CM

## 2020-06-28 DIAGNOSIS — K200 Eosinophilic esophagitis: Secondary | ICD-10-CM

## 2020-06-28 DIAGNOSIS — F330 Major depressive disorder, recurrent, mild: Secondary | ICD-10-CM

## 2020-06-28 MED ORDER — OMEPRAZOLE 40 MG PO CPDR: 40.0000 mg | DELAYED_RELEASE_CAPSULE | Freq: Every day | ORAL | 0 refills | Status: DC

## 2020-06-28 MED ORDER — DULOXETINE HCL 60 MG PO CPEP: 60.0000 mg | ORAL_CAPSULE | Freq: Every day | ORAL | 0 refills | Status: DC

## 2020-06-28 NOTE — Telephone Encounter (Signed)
Medication:  DULoxetine (CYMBALTA) 60 MG capsule [[id number]]  omeprazole (PRILOSEC) 40 MG capsule [[id number]]   Has the patient contacted their pharmacy?   (Agent: If yes, when and what did the pharmacy advise?) To call the office   Preferred Pharmacy (with phone number or street name):  [place], [place]  [address] [place] [address]  Phone:  [phone] Fax:  [phone]  Agent: Please be advised that RX refills may take up to 3 business days. We ask that you follow-up with your pharmacy.

## 2020-06-28 NOTE — Telephone Encounter (Signed)
Requested Prescriptions  Pending Prescriptions Disp Refills  . omeprazole (PRILOSEC) 40 MG capsule 90 capsule 0    Sig: Take 1 capsule (40 mg total) by mouth daily.     Gastroenterology: Proton Pump Inhibitors Passed - 06/28/2020  1:35 PM      Passed - Valid encounter within last 12 months    Recent Outpatient Visits          2 weeks ago [place], MD   11 months ago Well adult exam   [facility] Steele Sizer, MD   1 year ago Mild recurrent major depression [facility])   [facility] Steele Sizer, MD   1 year ago Acute back pain with radiculopathy   [facility] Steele Sizer, MD   1 year ago Acute right-sided low back pain with right-sided sciatica   [place], [place]             . DULoxetine (CYMBALTA) 60 MG capsule 90 capsule 0    Sig: Take 1 capsule (60 mg total) by mouth daily.     Psychiatry: Antidepressants - SNRI Failed - 06/28/2020  1:35 PM      Failed - Last BP in normal range    BP Readings from Last 1 Encounters:  01/09/20 (!) 149/94         Passed - Completed PHQ-2 or PHQ-9 in the last 360 days      Passed - Valid encounter within last 6 months    Recent Outpatient Visits          2 weeks ago [place], MD   11 months ago Well adult exam   [facility] Steele Sizer, MD   1 year ago Mild recurrent major depression [facility])   [facility] Steele Sizer, MD   1 year ago Acute back pain with radiculopathy   [facility] Steele Sizer, MD   1 year ago Acute right-sided low back pain with right-sided sciatica   [place], [place]

## 2020-08-09 ENCOUNTER — Other Ambulatory Visit: Payer: Self-pay | Admitting: Family Medicine

## 2020-08-09 DIAGNOSIS — F5104 Psychophysiologic insomnia: Secondary | ICD-10-CM

## 2020-08-09 DIAGNOSIS — L509 Urticaria, unspecified: Secondary | ICD-10-CM

## 2020-08-09 DIAGNOSIS — F419 Anxiety disorder, unspecified: Secondary | ICD-10-CM

## 2020-09-09 ENCOUNTER — Telehealth: Payer: Self-pay

## 2020-09-09 ENCOUNTER — Other Ambulatory Visit: Payer: Self-pay | Admitting: Family Medicine

## 2020-09-09 DIAGNOSIS — F5104 Psychophysiologic insomnia: Secondary | ICD-10-CM

## 2020-09-09 DIAGNOSIS — L509 Urticaria, unspecified: Secondary | ICD-10-CM

## 2020-09-09 DIAGNOSIS — F419 Anxiety disorder, unspecified: Secondary | ICD-10-CM

## 2020-09-09 NOTE — Telephone Encounter (Signed)
Copied from [place] [id number]. Topic: Quick Communication - Rx Refill/Question >> Sep 09, 2020  8:58 AM Tessa Lerner A wrote: Medication: hydrOXYzine (ATARAX/VISTARIL) 50 MG tablet  Has the patient contacted their pharmacy? Yes. Patient has contacted their pharmacy and been directed to contact their PCP  Preferred Pharmacy (with phone number or street name): [place], [place]  Phone:  [phone] Fax:  [phone]   Agent: Please be advised that RX refills may take up to 3 business days. We ask that you follow-up with your pharmacy.

## 2020-09-09 NOTE — Telephone Encounter (Signed)
Requested medication (s) are due for refill today: yes  Requested medication (s) are on the active medication list: yes  Last refill:  08/09/2020  Future visit scheduled: no  Notes to clinic:  Patient given courtesy refill  Appointment has not been scheduled    Requested Prescriptions  Pending Prescriptions Disp Refills   hydrOXYzine (ATARAX/VISTARIL) 50 MG tablet 30 tablet 0    Sig: Take 1 tablet (50 mg total) by mouth at bedtime.      Ear, Nose, and Throat:  Antihistamines Passed - 09/09/2020  9:03 AM      Passed - Valid encounter within last 12 months    Recent Outpatient Visits           3 months ago COVID   [facility] Tow[place] Malkin, MD   1 year ago Well adult exam   [facility] Steele Sizer, MD   1 year ago Mild recurrent major depression [facility])   [facility] Steele Sizer, MD   1 year ago Acute back pain with radiculopathy   [facility] Steele Sizer, MD   1 year ago Acute right-sided low back pain with right-sided sciatica   [place], [place]

## 2020-09-09 NOTE — Telephone Encounter (Signed)
Pt is scheduled for 09/13/20 Copied from [place] #[id number]. Topic: General - Other >> Sep 09, 2020  9:01 AM Tessa Lerner A wrote: Reason for CRM: Patient would like to know if it is possible to increase the dosage of their hydrOXYzine (ATARAX/VISTARIL) 50 MG tablet prescription  Patient declined to make an appointment to see their PCP about the medication at the time of call with agent  Patient has shared that they do not feel the medication is as effective as it was previously  Please contact to advise further when possible

## 2020-09-13 ENCOUNTER — Encounter: Payer: Self-pay | Admitting: Family Medicine

## 2020-09-13 ENCOUNTER — Other Ambulatory Visit: Payer: Self-pay

## 2020-09-13 ENCOUNTER — Ambulatory Visit (INDEPENDENT_AMBULATORY_CARE_PROVIDER_SITE_OTHER): Payer: 59 | Admitting: Family Medicine

## 2020-09-13 VITALS — BP 116/72 | HR 70 | Temp 98.4°F | Resp 16 | Ht 62.0 in | Wt 120.0 lb

## 2020-09-13 DIAGNOSIS — Z1231 Encounter for screening mammogram for malignant neoplasm of breast: Secondary | ICD-10-CM

## 2020-09-13 DIAGNOSIS — I700 Atherosclerosis of aorta: Secondary | ICD-10-CM | POA: Diagnosis not present

## 2020-09-13 DIAGNOSIS — M47816 Spondylosis without myelopathy or radiculopathy, lumbar region: Secondary | ICD-10-CM

## 2020-09-13 DIAGNOSIS — M545 Low back pain, unspecified: Secondary | ICD-10-CM

## 2020-09-13 DIAGNOSIS — F419 Anxiety disorder, unspecified: Secondary | ICD-10-CM | POA: Diagnosis not present

## 2020-09-13 DIAGNOSIS — Z8719 Personal history of other diseases of the digestive system: Secondary | ICD-10-CM

## 2020-09-13 DIAGNOSIS — M5136 Other intervertebral disc degeneration, lumbar region: Secondary | ICD-10-CM

## 2020-09-13 DIAGNOSIS — L509 Urticaria, unspecified: Secondary | ICD-10-CM

## 2020-09-13 DIAGNOSIS — F325 Major depressive disorder, single episode, in full remission: Secondary | ICD-10-CM | POA: Diagnosis not present

## 2020-09-13 DIAGNOSIS — G8929 Other chronic pain: Secondary | ICD-10-CM

## 2020-09-13 DIAGNOSIS — M5412 Radiculopathy, cervical region: Secondary | ICD-10-CM

## 2020-09-13 DIAGNOSIS — F5104 Psychophysiologic insomnia: Secondary | ICD-10-CM

## 2020-09-13 DIAGNOSIS — K51919 Ulcerative colitis, unspecified with unspecified complications: Secondary | ICD-10-CM | POA: Insufficient documentation

## 2020-09-13 MED ORDER — GABAPENTIN 300 MG PO CAPS
300.0000 mg | ORAL_CAPSULE | Freq: Every day | ORAL | 3 refills | Status: DC
Start: 2020-09-13 — End: 2021-09-27

## 2020-09-13 MED ORDER — HYDROXYZINE HCL 50 MG PO TABS: 50.0000 mg | ORAL_TABLET | Freq: Every day | ORAL | 3 refills | Status: DC

## 2020-09-13 NOTE — Progress Notes (Signed)
Name: Kara Harrell   MRN: [id number]    DOB: 1971-03-24   Date:09/13/2020       Progress Note  Subjective  Chief Complaint  Adjust Medication  HPI  MDD/GAD: she was taking Duloxetine but stopped months ago, she states she is back in church, no longer feeling mad at God.   She states her son died in June 18, 2014. She has two more grandchildren from his oldest son. She states hydroxizine at night to help her sleep but also for hives.    Moderate right Facet arthropathy at L2-3 and mild lumbar DDD without stenosis or impingement . She was seen by Dr. Phyllis Ginger and was given Gabapentin, she states only able to take it at night but helps with her pain. No radiculitis. She has noticed tingling and heaviness on her hands when she wakes up, explained likely radiculitis.   History of skin cancer: left lower leg , she saw Dr. Kris Mouton, she had two follow ups, discussed to keep up with yearly follow up  Gastritis and esophagitis: she states dysphagia resolved, stopped taking Omeprazole and is doing well.   Atherosclerosis aorta: discussed results with her, discussed rechecking lipid panel and take statin therapy, but she is not ready for it  History of colitis: last colonoscopy was normal   Patient Active Problem List   Diagnosis Date Noted  . Ulcerative colitis with complication ([place]) 54/65/0354  . 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 Hypertension, benign 04/29/2019  . Eosinophilic esophagitis 65/68/1275  . Moderate single current episode of major depressive disorder ([place]) 07/30/2018  . Stricture and stenosis of esophagus     Past Surgical History:  Procedure Laterality Date  . CESAREAN SECTION    . COLONOSCOPY WITH PROPOFOL N/A 01/24/2019   Procedure: COLONOSCOPY WITH PROPOFOL;  Surgeon: Lin Landsman, MD;  Location: [facility] ENDOSCOPY;  Service: Gastroenterology;  Laterality: N/A;  . Cyst Removed from ovary    . ESOPHAGOGASTRODUODENOSCOPY (EGD)  WITH PROPOFOL N/A 10/18/2015   Procedure: ESOPHAGOGASTRODUODENOSCOPY (EGD) WITH PROPOFOL with dialation;  Surgeon: Lucilla Lame, MD;  Location: [place];  Service: Endoscopy;  Laterality: N/A;  . ESOPHAGOGASTRODUODENOSCOPY (EGD) WITH PROPOFOL N/A 01/24/2019   Procedure: ESOPHAGOGASTRODUODENOSCOPY (EGD) WITH PROPOFOL;  Surgeon: Lin Landsman, MD;  Location: [facility] ENDOSCOPY;  Service: Gastroenterology;  Laterality: N/A;  . FOOT SURGERY      Family History  Problem Relation Age of Onset  . Cancer Mother        Lung  . Diabetes Sister   . Breast cancer Neg Hx     Social History   Tobacco Use  . Smoking status: Former Smoker    Packs/day: 0.50    Years: 20.00    Pack years: 10.00    Types: Cigarettes    Quit date: 10/24/2016    Years since quitting: 3.8  . Smokeless tobacco: Never Used  . Tobacco comment: smokes socially, when having a drink  Substance Use Topics  . Alcohol use: Yes    Alcohol/week: 5.0 standard drinks    Types: 5 Cans of beer per week     Current Outpatient Medications:  .  gabapentin (NEURONTIN) 300 MG capsule, Take 1 capsule (300 mg total) by mouth at bedtime., Disp: 90 capsule, Rfl: 3 .  hydrOXYzine (ATARAX/VISTARIL) 50 MG tablet, Take 1 tablet (50 mg total) by mouth at bedtime., Disp: 90 tablet, Rfl: 3  No Known Allergies  I personally reviewed active problem list, medication  list, allergies, family history, social history, health maintenance with the patient/caregiver today.   ROS  Constitutional: Negative for fever or weight change.  Respiratory: Negative for cough and shortness of breath.   Cardiovascular: Negative for chest pain or palpitations.  Gastrointestinal: Negative for abdominal pain, no bowel changes.  Musculoskeletal: Negative for gait problem or joint swelling.  Skin: Negative for rash.  Neurological: Negative for dizziness or headache.  No other specific complaints in a complete review of systems (except as listed in HPI  above).  Objective  Vitals:   09/13/20 1109  BP: 116/72  Pulse: 70  Resp: 16  Temp: 98.4 F (36.9 C)  TempSrc: Oral  SpO2: 98%  Weight: 120 lb (54.4 kg)  Height: 5' 2"  (1.575 m)    Body mass index is 21.95 kg/m.  Physical Exam   Constitutional: Patient appears well-developed and well-nourished.  No distress.  HEENT: head atraumatic, normocephalic, pupils equal and reactive to light, neck supple Cardiovascular: Normal rate, regular rhythm and normal heart sounds.  No murmur heard. No BLE edema. Pulmonary/Chest: Effort normal and breath sounds normal. No respiratory distress. Abdominal: Soft.  There is no tenderness. Muscular skeletal: pain with abduction of right shoulder, negative tinnels and phalen's test, decrease rom of neck, no pain during palpation of spinal processes, negative straight leg raise, normal grip  Psychiatric: Patient has a normal mood and affect. behavior is normal. Judgment and thought content normal.  PHQ2/9: Depression screen [facility] 2/9 09/13/2020 06/09/2020 07/14/2019 04/29/2019 02/05/2019  Decreased Interest 0 0 0 1 1  Down, Depressed, Hopeless 0 0 3 1 1   PHQ - 2 Score 0 0 3 2 2   Altered sleeping 0 - 0 1 1  Tired, decreased energy 0 - 0 2 1  Change in appetite 0 - 0 0 0  Feeling bad or failure about yourself  0 - 0 0 0  Trouble concentrating 0 - 0 0 0  Moving slowly or fidgety/restless 0 - 0 0 0  Suicidal thoughts 0 - 0 0 0  PHQ-9 Score 0 - 3 5 4   Difficult doing work/chores - - Not difficult at all Not difficult at all Not difficult at all  Some recent data might be hidden    phq 9 is negative   Fall Risk: Fall Risk  09/13/2020 06/09/2020 07/14/2019 04/29/2019 02/05/2019  Falls in the past year? 0 0 0 0 0  Number falls in past yr: 0 0 0 0 0  Injury with Fall? 0 0 0 0 0  Follow up - - - - -     Functional Status Survey: Is the patient deaf or have difficulty hearing?: No Does the patient have difficulty seeing, even when wearing glasses/contacts?:  No Does the patient have difficulty concentrating, remembering, or making decisions?: No Does the patient have difficulty walking or climbing stairs?: No Does the patient have difficulty dressing or bathing?: No Does the patient have difficulty doing errands alone such as visiting a doctor's office or shopping?: No    Assessment & Plan  1. Atherosclerosis of aorta ([place])  Refuses therapy   2. 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[facility] [place] [facility])  Doing well   3. Anxiety  - hydrOXYzine (ATARAX/VISTARIL) 50 MG tablet; Take 1 tablet (50 mg total) by mouth at bedtime.  Dispense: 90 tablet; Refill: 3  4. History of colitis   5. Breast cancer screening by mammogram  - MM 3D SCREEN BREAST BILATERAL; Future  6. Hives  - hydrOXYzine (ATARAX/VISTARIL) 50 MG tablet; Take  1 tablet (50 mg total) by mouth at bedtime.  Dispense: 90 tablet; Refill: 3  7. Psychophysiological insomnia   hydrOXYzine (ATARAX/VISTARIL) 50 MG tablet; Take 1 tablet (50 mg total) by mouth at bedtime.  Dispense: 90 tablet; Refill: 3  8. Facet arthropathy, lumbar  - gabapentin (NEURONTIN) 300 MG capsule; Take 1 capsule (300 mg total) by mouth at bedtime.  Dispense: 90 capsule; Refill: 3  9. Degenerative disc disease, lumbar  - gabapentin (NEURONTIN) 300 MG capsule; Take 1 capsule (300 mg total) by mouth at bedtime.  Dispense: 90 capsule; Refill: 3  10. Chronic midline low back pain without sciatica  - gabapentin (NEURONTIN) 300 MG capsule; Take 1 capsule (300 mg total) by mouth at bedtime.  Dispense: 90 capsule; Refill: 3  11.Cervical Radiculitis  - Ambulatory referral to Pain Clinic

## 2020-10-05 ENCOUNTER — Ambulatory Visit
Admission: RE | Admit: 2020-10-05 | Discharge: 2020-10-05 | Disposition: A | Discharge status: Home / Self Care | Payer: 59 | Source: Ambulatory Visit | Attending: Family Medicine | Admitting: Family Medicine

## 2020-10-05 ENCOUNTER — Other Ambulatory Visit: Payer: Self-pay

## 2020-10-05 DIAGNOSIS — Z1231 Encounter for screening mammogram for malignant neoplasm of breast: Secondary | ICD-10-CM | POA: Insufficient documentation

## 2020-11-17 ENCOUNTER — Other Ambulatory Visit: Payer: Self-pay | Admitting: Family Medicine

## 2020-11-17 DIAGNOSIS — M5412 Radiculopathy, cervical region: Secondary | ICD-10-CM

## 2020-12-06 ENCOUNTER — Other Ambulatory Visit: Payer: 59

## 2021-01-11 ENCOUNTER — Other Ambulatory Visit: Payer: Self-pay | Admitting: Family Medicine

## 2021-01-11 DIAGNOSIS — K200 Eosinophilic esophagitis: Secondary | ICD-10-CM

## 2021-01-11 NOTE — Telephone Encounter (Signed)
Pts next appt is 01/18/21

## 2021-01-11 NOTE — Telephone Encounter (Signed)
   Notes to clinic:  The original prescription was discontinued on 09/13/2020 by Steele Sizer, MD for the following reason: Completed Course. Renewing this prescription may not be appropriate   Requested Prescriptions  Pending Prescriptions Disp Refills   omeprazole (PRILOSEC) 40 MG capsule [Pharmacy Med Name: OMEPRAZOLE DR 40 MG CAPSULE] 90 capsule 0    Sig: TAKE ONE CAPSULE BY MOUTH DAILY     Gastroenterology: Proton Pump Inhibitors Passed - 01/11/2021 11:20 AM      Passed - Valid encounter within last 12 months    Recent Outpatient Visits           4 months ago 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[facility])   [facility] Steele Sizer, MD   7 months ago [place], MD   1 year ago Well adult exam   [facility] Steele Sizer, MD   1 year ago Mild recurrent major depression [facility])   [facility] Steele Sizer, MD   1 year ago Acute back pain with radiculopathy   [facility] Steele Sizer, MD       Future Appointments             In 1 week Steele Sizer, MD [facility], [facility]

## 2021-01-18 ENCOUNTER — Other Ambulatory Visit: Payer: Self-pay

## 2021-01-18 ENCOUNTER — Ambulatory Visit (INDEPENDENT_AMBULATORY_CARE_PROVIDER_SITE_OTHER): Payer: 59 | Admitting: Family Medicine

## 2021-01-18 ENCOUNTER — Encounter: Payer: Self-pay | Admitting: Family Medicine

## 2021-01-18 VITALS — BP 120/82 | HR 86 | Temp 98.0°F | Resp 16 | Ht 62.0 in | Wt 126.0 lb

## 2021-01-18 DIAGNOSIS — N951 Menopausal and female climacteric states: Secondary | ICD-10-CM

## 2021-01-18 DIAGNOSIS — F419 Anxiety disorder, unspecified: Secondary | ICD-10-CM

## 2021-01-18 DIAGNOSIS — F325 Major depressive disorder, single episode, in full remission: Secondary | ICD-10-CM

## 2021-01-18 DIAGNOSIS — Z0000 Encounter for general adult medical examination without abnormal findings: Secondary | ICD-10-CM | POA: Diagnosis not present

## 2021-01-18 DIAGNOSIS — Z23 Encounter for immunization: Secondary | ICD-10-CM

## 2021-01-18 DIAGNOSIS — K219 Gastro-esophageal reflux disease without esophagitis: Secondary | ICD-10-CM

## 2021-01-18 DIAGNOSIS — E785 Hyperlipidemia, unspecified: Secondary | ICD-10-CM

## 2021-01-18 DIAGNOSIS — Z13220 Encounter for screening for lipoid disorders: Secondary | ICD-10-CM

## 2021-01-18 DIAGNOSIS — Z114 Encounter for screening for human immunodeficiency virus [HIV]: Secondary | ICD-10-CM

## 2021-01-18 MED ORDER — OMEPRAZOLE 40 MG PO CPDR: 40.0000 mg | DELAYED_RELEASE_CAPSULE | Freq: Every day | ORAL | 1 refills | Status: DC

## 2021-01-18 MED ORDER — BUPROPION HCL ER (XL) 150 MG PO TB24: 150.0000 mg | ORAL_TABLET | Freq: Every day | ORAL | 1 refills | Status: DC

## 2021-01-18 NOTE — Patient Instructions (Signed)
Preventive Care 68-52 Years Old, Female Preventive care refers to lifestyle choices and visits with your health care provider that can promote health and wellness. This includes: A yearly physical exam. This is also called an annual wellness visit. Regular dental and eye exams. Immunizations. Screening for certain conditions. Healthy lifestyle choices, such as: Eating a healthy diet. Getting regular exercise. Not using drugs or products that contain nicotine and tobacco. Limiting alcohol use. What can I expect for my preventive care visit? Physical exam Your health care provider will check your: Height and weight. These may be used to calculate your BMI (body mass index). BMI is a measurement that tells if you are at a healthy weight. Heart rate and blood pressure. Body temperature. Skin for abnormal spots. Counseling Your health care provider may ask you questions about your: Past medical problems. Family's medical history. Alcohol, tobacco, and drug use. Emotional well-being. Home life and relationship well-being. Sexual activity. Diet, exercise, and sleep habits. Work and work Statistician. Access to firearms. Method of birth control. Menstrual cycle. Pregnancy history. What immunizations do I need?  Vaccines are usually given at various ages, according to a schedule. Your health care provider will recommend vaccines for you based on your age, medicalhistory, and lifestyle or other factors, such as travel or where you work. What tests do I need? Blood tests Lipid and cholesterol levels. These may be checked every 5 years, or more often if you are over 37 years old. Hepatitis C test. Hepatitis B test. Screening Lung cancer screening. You may have this screening every year starting at age 30 if you have a 30-pack-year history of smoking and currently smoke or have quit within the past 15 years. Colorectal cancer screening. All adults should have this screening starting at  age 23 and continuing until age 3. Your health care provider may recommend screening at age 88 if you are at increased risk. You will have tests every 1-10 years, depending on your results and the type of screening test. Diabetes screening. This is done by checking your blood sugar (glucose) after you have not eaten for a while (fasting). You may have this done every 1-3 years. Mammogram. This may be done every 1-2 years. Talk with your health care provider about when you should start having regular mammograms. This may depend on whether you have a family history of breast cancer. BRCA-related cancer screening. This may be done if you have a family history of breast, ovarian, tubal, or peritoneal cancers. Pelvic exam and Pap test. This may be done every 3 years starting at age 52. Starting at age 54, this may be done every 5 years if you have a Pap test in combination with an HPV test. Other tests STD (sexually transmitted disease) testing, if you are at risk. Bone density scan. This is done to screen for osteoporosis. You may have this scan if you are at high risk for osteoporosis. Talk with your health care provider about your test results, treatment options,and if necessary, the need for more tests. Follow these instructions at home: Eating and drinking  Eat a diet that includes fresh fruits and vegetables, whole grains, lean protein, and low-fat dairy products. Take vitamin and mineral supplements as recommended by your health care provider. Do not drink alcohol if: Your health care provider tells you not to drink. You are pregnant, may be pregnant, or are planning to become pregnant. If you drink alcohol: Limit how much you have to 0-1 drink a day. Be aware  of how much alcohol is in your drink. In the [place] one drink equals one 12 oz bottle of beer (355 mL), one 5 oz glass of wine (148 mL), or one 1 oz glass of hard liquor (44 mL).  Lifestyle Take daily care of your teeth and  gums. Brush your teeth every morning and night with fluoride toothpaste. Floss one time each day. Stay active. Exercise for at least 30 minutes 5 or more days each week. Do not use any products that contain nicotine or tobacco, such as cigarettes, e-cigarettes, and chewing tobacco. If you need help quitting, ask your health care provider. Do not use drugs. If you are sexually active, practice safe sex. Use a condom or other form of protection to prevent STIs (sexually transmitted infections). If you do not wish to become pregnant, use a form of birth control. If you plan to become pregnant, see your health care provider for a prepregnancy visit. If told by your health care provider, take low-dose aspirin daily starting at age 29. Find healthy ways to cope with stress, such as: Meditation, yoga, or listening to music. Journaling. Talking to a trusted person. Spending time with friends and family. Safety Always wear your seat belt while driving or riding in a vehicle. Do not drive: If you have been drinking alcohol. Do not ride with someone who has been drinking. When you are tired or distracted. While texting. Wear a helmet and other protective equipment during sports activities. If you have firearms in your house, make sure you follow all gun safety procedures. What's next? Visit your health care provider once a year for an annual wellness visit. Ask your health care provider how often you should have your eyes and teeth checked. Stay up to date on all vaccines. This information is not intended to replace advice given to you by your health care provider. Make sure you discuss any questions you have with your healthcare provider. Document Revised: 02/17/2020 Document Reviewed: 01/24/2018 Elsevier Patient Education  2022 [employer].
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
## 2021-01-19 LAB — CBC WITH DIFFERENTIAL/PLATELET
Absolute Monocytes: 399 cells/uL (ref 200–950)
Basophils Absolute: 17 cells/uL (ref 0–200)
Basophils Relative: 0.3 %
Eosinophils Absolute: 97 cells/uL (ref 15–500)
Eosinophils Relative: 1.7 %
HCT: 41.6 % (ref 35.0–45.0)
Hemoglobin: 14.1 g/dL (ref 11.7–15.5)
Lymphs Abs: 1237 cells/uL (ref 850–3900)
MCH: 29.2 pg (ref 27.0–33.0)
MCHC: 33.9 g/dL (ref 32.0–36.0)
MCV: 86.1 fL (ref 80.0–100.0)
MPV: 9.6 fL (ref 7.5–12.5)
Monocytes Relative: 7 %
Neutro Abs: 3950 cells/uL (ref 1500–7800)
Neutrophils Relative %: 69.3 %
Platelets: 324 10*3/uL (ref 140–400)
RBC: 4.83 10*6/uL (ref 3.80–5.10)
RDW: 12.5 % (ref 11.0–15.0)
Total Lymphocyte: 21.7 %
WBC: 5.7 10*3/uL (ref 3.8–10.8)

## 2021-01-19 LAB — COMPLETE METABOLIC PANEL WITH GFR
AG Ratio: 1.5 (calc) (ref 1.0–2.5)
ALT: 15 U/L (ref 6–29)
AST: 20 U/L (ref 10–35)
Albumin: 4.4 g/dL (ref 3.6–5.1)
Alkaline phosphatase (APISO): 81 U/L (ref 37–153)
BUN: 16 mg/dL (ref 7–25)
CO2: 27 mmol/L (ref 20–32)
Calcium: 9.3 mg/dL (ref 8.6–10.4)
Chloride: 103 mmol/L (ref 98–110)
Creat: 0.85 mg/dL (ref 0.50–1.03)
Globulin: 2.9 g/dL (calc) (ref 1.9–3.7)
Glucose, Bld: 79 mg/dL (ref 65–99)
Potassium: 4.4 mmol/L (ref 3.5–5.3)
Sodium: 138 mmol/L (ref 135–146)
Total Bilirubin: 0.4 mg/dL (ref 0.2–1.2)
Total Protein: 7.3 g/dL (ref 6.1–8.1)
eGFR: 82 mL/min/{1.73_m2} (ref >=60)

## 2021-01-19 LAB — LIPID PANEL
Cholesterol: 243 mg/dL — ABNORMAL HIGH (ref <200)
HDL: 73 mg/dL (ref >=50)
LDL Cholesterol (Calc): 142 mg/dL (calc) — ABNORMAL HIGH
Non-HDL Cholesterol (Calc): 170 mg/dL (calc) — ABNORMAL HIGH (ref <130)
Total CHOL/HDL Ratio: 3.3 (calc) (ref <5.0)
Triglycerides: 152 mg/dL — ABNORMAL HIGH (ref <150)

## 2021-01-19 LAB — HIV ANTIBODY (ROUTINE TESTING W REFLEX): HIV 1&2 Ab, 4th Generation: NONREACTIVE

## 2021-02-10 IMAGING — CT CT ABDOMEN AND PELVIS WITH CONTRAST
2 of 5 series · 15 of 46 positions shown, 17 images · IV contrast (APPLIED)
Comparison: February 12, 2014

CLINICAL DATA: Abdominal and flank region pain on the right

EXAM:
CT ABDOMEN AND PELVIS WITH CONTRAST
TECHNIQUE: Multidetector CT imaging of the abdomen and pelvis was performed
using the standard protocol following bolus administration of
intravenous contrast. Oral contrast was also administered.
CONTRAST:  75mL OMNIPAQUE IOHEXOL 300 MG/ML  SOLN

[Series 2: axial st · axial · 0.64mm/px · z∈[-1074,-714]mm · 12 of 82 slices shown, 14 images]
[im 5/82  soft-tissue]
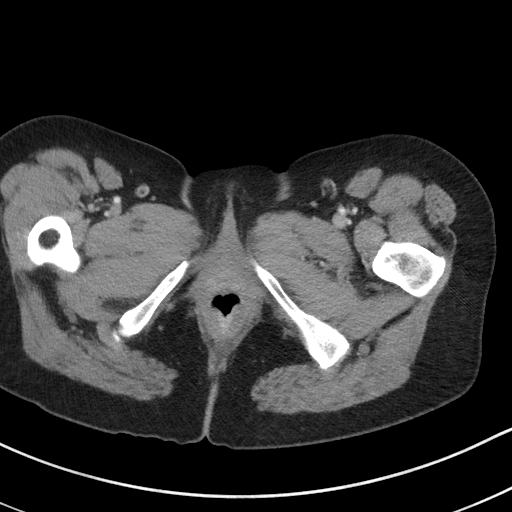
[im 5/82  bone]
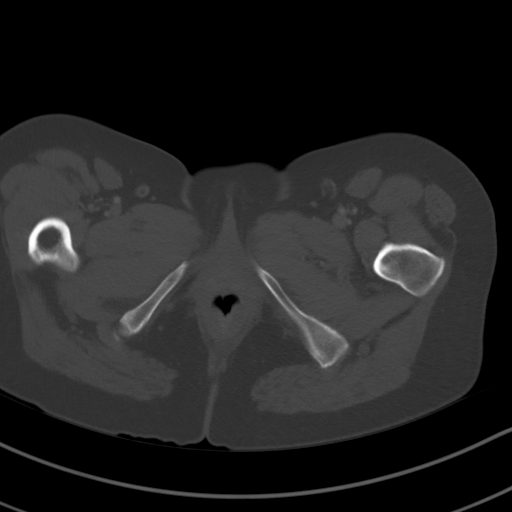
[im 13/82  soft-tissue]
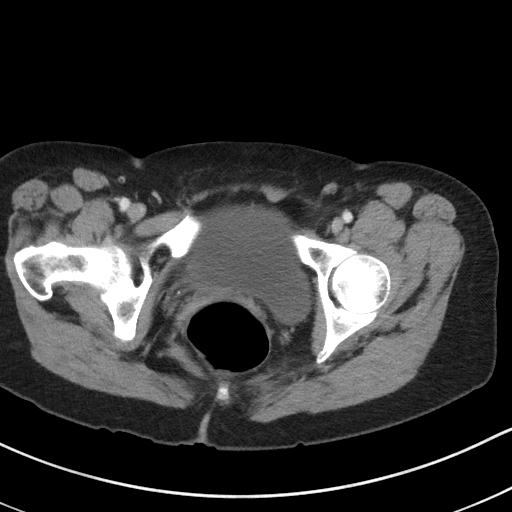
[im 18/82  soft-tissue]
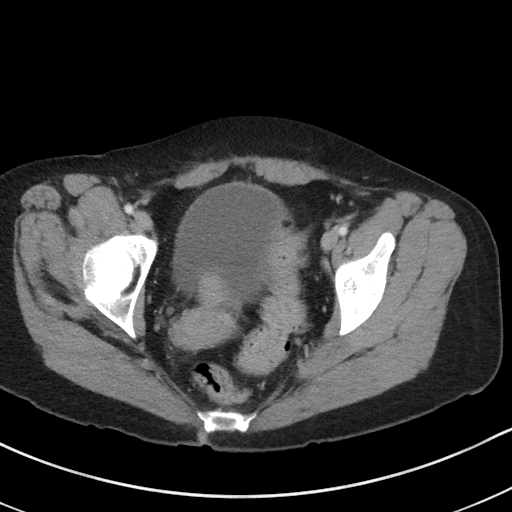
[im 26/82  soft-tissue]
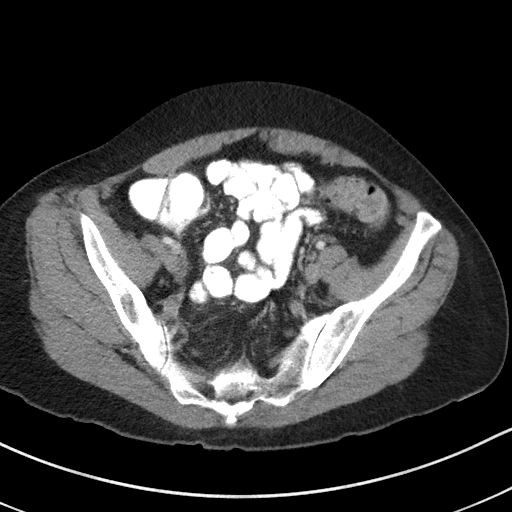
[im 30/82  soft-tissue]
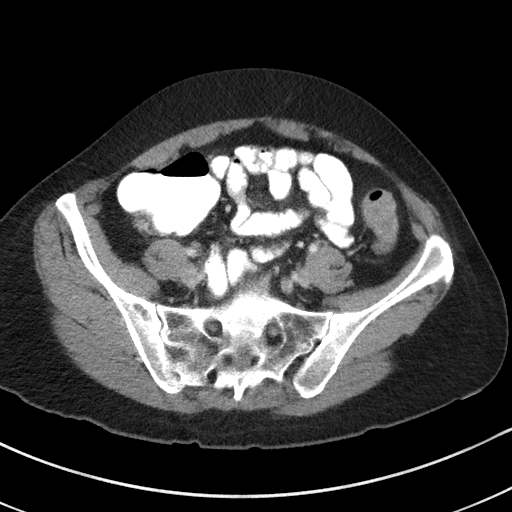
[im 39/82  soft-tissue]
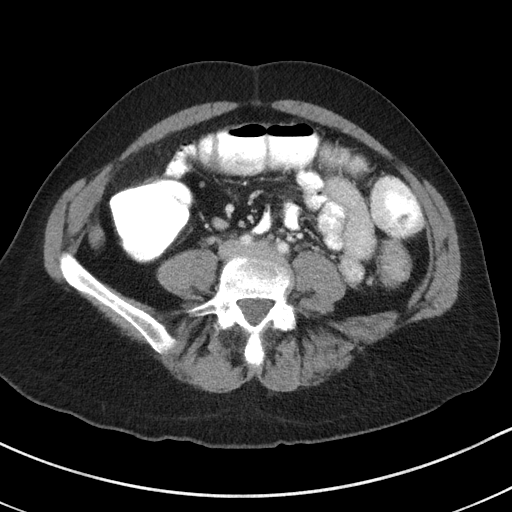
[im 43/82  soft-tissue]
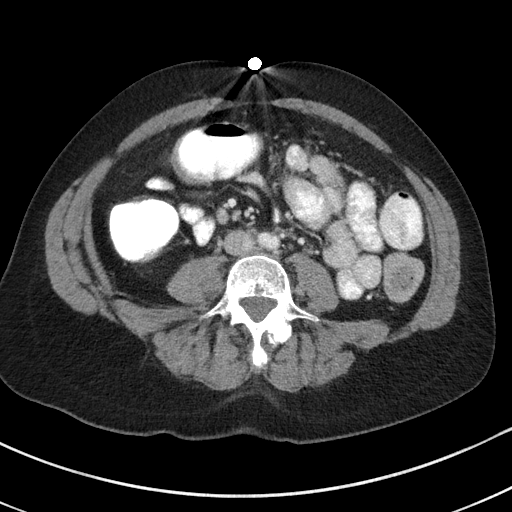
[im 52/82  soft-tissue]
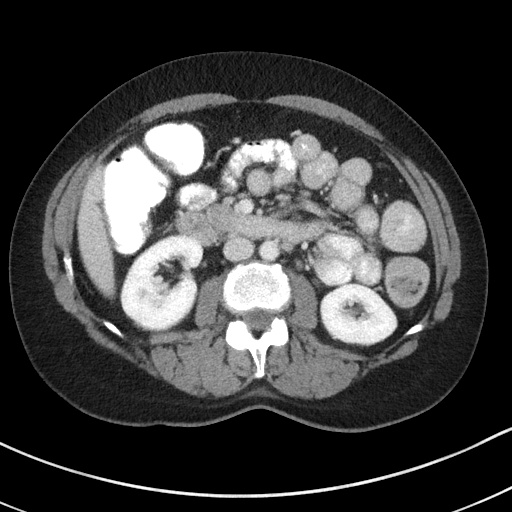
[im 56/82  soft-tissue]
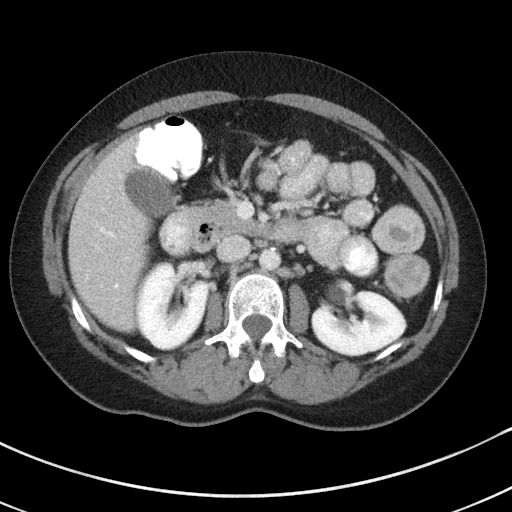
[im 56/82  bone]
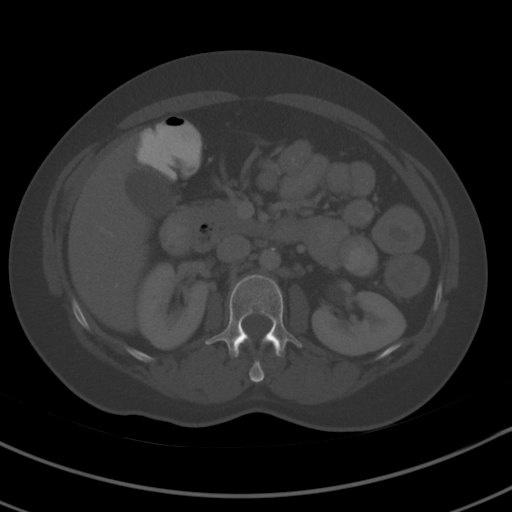
[im 64/82  soft-tissue]
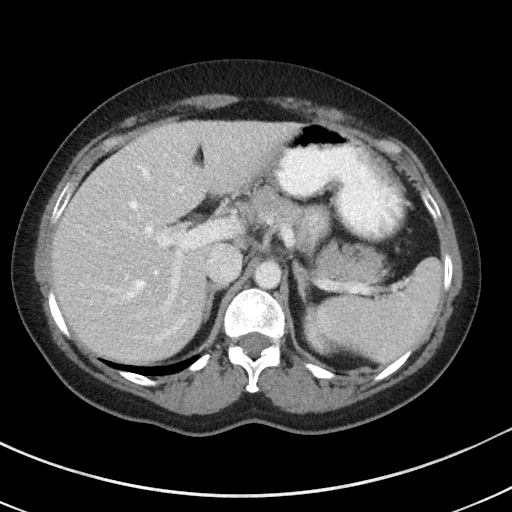
[im 69/82  soft-tissue]
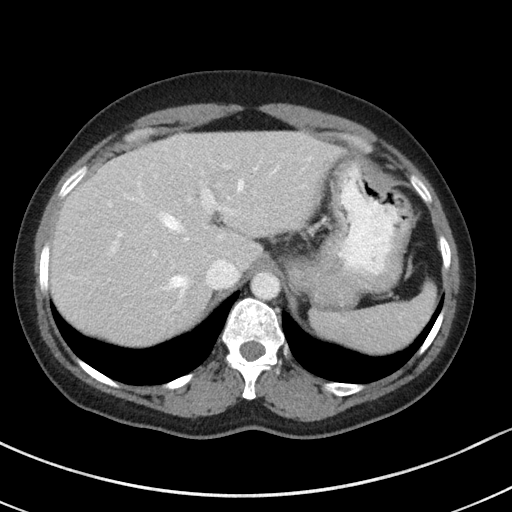
[im 77/82  soft-tissue]
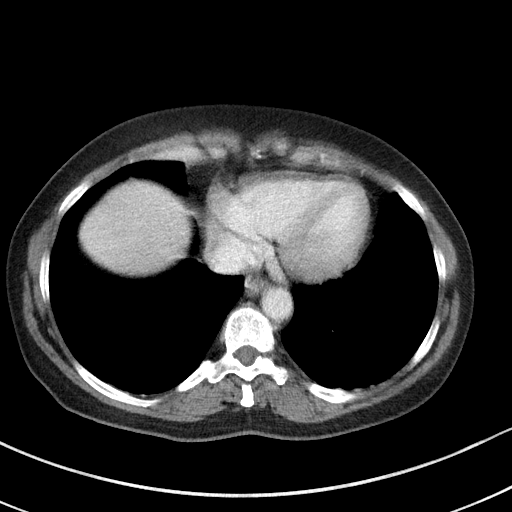

[Series 5: coronal st · coronal · 0.70mm/px · 3 of 88 slices shown]
[im 30/88  soft-tissue]
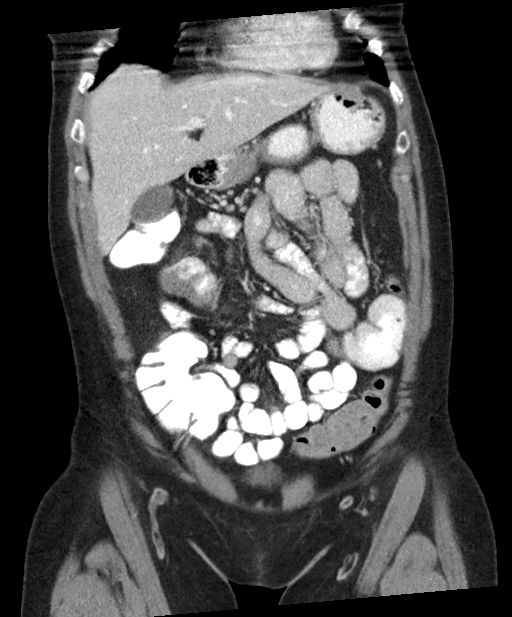
[im 39/88  soft-tissue]
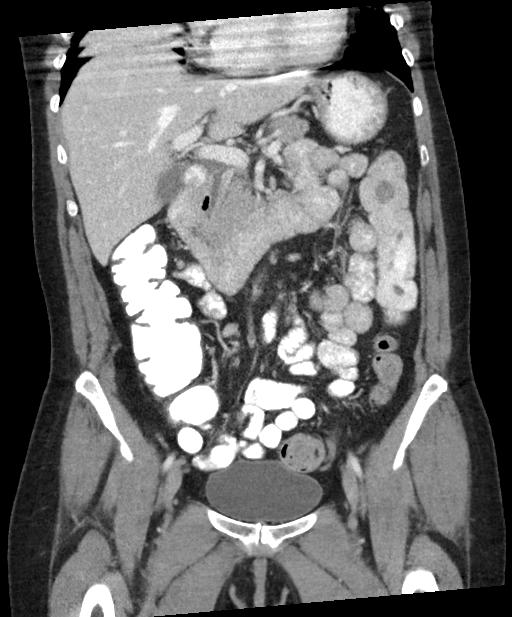
[im 49/88  soft-tissue]
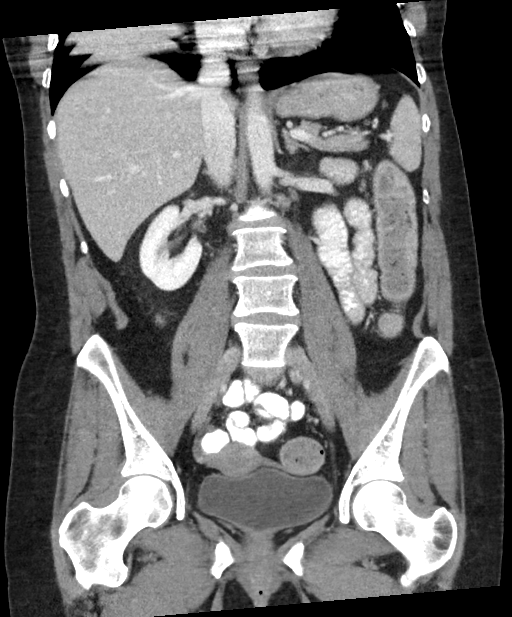

[15 of 46 positions shown; findings below may reference images not displayed]

FINDINGS: Lower chest: Visualized lung bases are clear.

Hepatobiliary: No focal liver lesions are appreciable. Note that a
small portion of the dome of the liver is not visualized.
Gallbladder wall is not appreciably thickened. There is no biliary
duct dilatation.

Pancreas: There is no pancreatic mass or inflammatory focus.

Spleen: No splenic lesions are evident.

Adrenals/Urinary Tract: Adrenals bilaterally appear normal. Kidneys
bilaterally show no evident mass or hydronephrosis on either side.
There is no appreciable renal or ureteral calculus on either side.
Urinary bladder is midline with wall thickness within normal limits.

Stomach/Bowel: There is fluid throughout most of the small and large
bowel. Several loops of distal transverse and descending colon have
mildly thickened walls. There is no appreciable transition zone to
suggest bowel obstruction. There is no appreciable mesenteric
thickening. No bowel obstruction is evident. The terminal ileum
appears unremarkable. There is lipomatous infiltration of the
ileocecal valve. No free air or portal venous air evident.

Vascular/Lymphatic: There is no abdominal aortic aneurysm. There is
slight aortic atherosclerosis. Major mesenteric arterial vessels
appear patent. No adenopathy is evident in the abdomen or pelvis.

Reproductive: Uterus is anteverted.  No evident pelvic mass.

Other: Appendix appears unremarkable. No abscess or ascites is
evident in the abdomen or pelvis.

Musculoskeletal: There are foci of degenerative change in the lower
lumbar spine. There are no blastic or lytic bone lesions. There is
no appreciable intramuscular or abdominal wall lesion.
IMPRESSION: 1. Findings indicative of a degree of colitis involving portions of
the transverse and descending colon, etiology uncertain. Fluid
throughout the small bowel may indicate a degree of underlying
enteritis as well. No evident bowel ischemia. No bowel obstruction.

2.  No abscess in the abdomen or pelvis.  Appendix appears normal.

3. No evident renal or ureteral calculus. No hydronephrosis. Urinary
bladder wall thickness normal.

## 2021-02-15 ENCOUNTER — Encounter: Payer: Self-pay | Admitting: Family Medicine

## 2021-02-15 ENCOUNTER — Ambulatory Visit: Payer: Self-pay | Admitting: *Deleted

## 2021-02-15 ENCOUNTER — Telehealth (INDEPENDENT_AMBULATORY_CARE_PROVIDER_SITE_OTHER): Payer: 59 | Admitting: Family Medicine

## 2021-02-15 DIAGNOSIS — J069 Acute upper respiratory infection, unspecified: Secondary | ICD-10-CM | POA: Diagnosis not present

## 2021-02-15 MED ORDER — METHYLPREDNISOLONE 4 MG PO TBPK: ORAL_TABLET | ORAL | 0 refills | Status: DC

## 2021-02-15 NOTE — Telephone Encounter (Signed)
Patient is calling with symptoms that started after trip to beach- Wednesday last week- sore throat, cough- now progressed to headache, chest/nasal congestion, low grade fever, SOB with activity- patient states she had negative COVID home test on Sunday. Virtual visit scheduled for patient due to symptom profile.

## 2021-02-15 NOTE — Telephone Encounter (Signed)
Reason for Disposition  [1] MILD difficulty breathing (e.g., minimal/no SOB at rest, SOB with walking, pulse <100) AND [2] still present when not coughing  Answer Assessment - Initial Assessment Questions 1. ONSET: "When did the cough begin?"      Wednesday- scratchy throat 2. SEVERITY: "How bad is the cough today?"      Cough worse at night- will have spells during day 3. SPUTUM: "Describe the color of your sputum" (none, dry cough; clear, white, yellow, green)     yellow 4. HEMOPTYSIS: "Are you coughing up any blood?" If so ask: "How much?" (flecks, streaks, tablespoons, etc.)     no 5. DIFFICULTY BREATHING: "Are you having difficulty breathing?" If Yes, ask: "How bad is it?" (e.g., mild, moderate, severe)    - MILD: No SOB at rest, mild SOB with walking, speaks normally in sentences, can lie down, no retractions, pulse < 100.    - MODERATE: SOB at rest, SOB with minimal exertion and prefers to sit, cannot lie down flat, speaks in phrases, mild retractions, audible wheezing, pulse 100-120.    - SEVERE: Very SOB at rest, speaks in single words, struggling to breathe, sitting hunched forward, retractions, pulse > 120      Chest tight- hard to get deep breath, moderate 6. FEVER: "Do you have a fever?" If Yes, ask: "What is your temperature, how was it measured, and when did it start?"     Off/on- low grade 7. CARDIAC HISTORY: "Do you have any history of heart disease?" (e.g., heart attack, congestive heart failure)      no 8. LUNG HISTORY: "Do you have any history of lung disease?"  (e.g., pulmonary embolus, asthma, emphysema)     no 9. PE RISK FACTORS: "Do you have a history of blood clots?" (or: recent major surgery, recent prolonged travel, bedridden)     no 10. OTHER SYMPTOMS: "Do you have any other symptoms?" (e.g., runny nose, wheezing, chest pain)       Nasal congestion, headache, ear pain- bilateral 11. PREGNANCY: "Is there any chance you are pregnant?" "When was your last menstrual  period?"       N/a 12. TRAVEL: "Have you traveled out of the country in the last month?" (e.g., travel history, exposures)       [place] vacation, negative home test-COVID Sunday  Protocols used: Cough - Acute Productive-A-AH

## 2021-02-15 NOTE — Progress Notes (Signed)
Virtual Visit via Phone Note  I connected with Kara Harrell on 02/15/21 at  9:40 AM EDT by phone and verified that I am speaking with the correct person using two identifiers.  Location: Patient: home Provider: [facility]   I discussed the limitations of evaluation and management by telemedicine and the availability of in person appointments. The patient expressed understanding and agreed to proceed.  History of Present Illness:  UPPER RESPIRATORY TRACT INFECTION - symptom onset 9/13 - COVID negative at home test Sunday  Worst symptom: Fever: subjective with chills Cough: yes Shortness of breath: no, sometimes hard to get deep breath Chest pain: yes, with cough Chest tightness: yes Chest congestion: yes Nasal congestion: yes Runny nose: yes Sore throat: yes Sinus pressure: yes Headache: yes Ear pain: yes bilateral Ear pressure: yes bilateral Vomiting: no Rash: no Fatigue: yes Sick contacts: no Context: worse Recurrent sinusitis: no Relief with OTC cold/cough medications: no  Treatments attempted: mucinex, ibuprofen, vitamin C, alka seltzer plus     Observations/Objective:  Patient had trouble connecting to video visit, entirety of visit conducted over the phone.  Speaks in full sentences, no respiratory distress.   Assessment and Plan:  Viral URI Moderate sx. COVID negative. Given worsened symptoms despite oral decongestant, will trial oral steroid. No current indication for antibiotics. Reviewed OTC symptom relief, return and emergency precautions.     I discussed the assessment and treatment plan with the patient. The patient was provided an opportunity to ask questions and all were answered. The patient agreed with the plan and demonstrated an understanding of the instructions.   The patient was advised to call back or seek an in-person evaluation if the symptoms worsen or if the condition fails to improve as anticipated.  I provided 11 minutes of  non-face-to-face time during this encounter.   Myles Gip, DO

## 2021-02-15 NOTE — Patient Instructions (Signed)
You have a cold and it should start to get better about 7 - 10 days after it started.    Take the steroid for 6 days.  For your nasal congestion and runny nose, try using Afrin (generic is Oxymetazoline) twice daily for 3 days.  Do not use for longer that 3 days.    Some other therapies you can try are: push fluids, rest, gargle warm salt water, use vaporizer or mist, and return office visit if symptoms persist or worsen.   Drinking warm liquids such as teas and soups can help with secretions and cough. A mist humidifier or vaporizer can work well to help with secretions and cough.  It is very important to clean the humidifier between use according to the instructions.    If you're still having trouble in the next week, come back and see [place].    Of course, if you start having trouble breathing, worsening fevers, vomiting and unable to hold down any fluids, or you have other concerns, don't hesitate to come back or go to the ED after hours.

## 2021-07-15 ENCOUNTER — Other Ambulatory Visit: Payer: Self-pay | Admitting: Family Medicine

## 2021-07-15 DIAGNOSIS — K219 Gastro-esophageal reflux disease without esophagitis: Secondary | ICD-10-CM

## 2021-07-15 DIAGNOSIS — F325 Major depressive disorder, single episode, in full remission: Secondary | ICD-10-CM

## 2021-07-15 DIAGNOSIS — F419 Anxiety disorder, unspecified: Secondary | ICD-10-CM

## 2021-07-16 NOTE — Telephone Encounter (Signed)
Requested medication (s) are due for refill today: yes  Requested medication (s) are on the active medication list: yes  Last refill:  01/18/21 #90 with 1 RF  Future visit scheduled: 01/20/22  Notes to clinic:  Pt does have a yearly appt for Aug, this med is a 6 month renewal, please assess.      Requested Prescriptions  Pending Prescriptions Disp Refills   buPROPion (WELLBUTRIN XL) 150 MG 24 hr tablet [Pharmacy Med Name: buPROPion HCL XL 150 MG TABLET] 90 tablet 1    Sig: TAKE ONE TABLET BY MOUTH DAILY     Psychiatry: Antidepressants - bupropion Passed - 07/15/2021  5:25 PM      Passed - Cr in normal range and within 360 days    Creat  Date Value Ref Range Status  01/18/2021 0.85 0.50 - 1.03 mg/dL Final          Passed - AST in normal range and within 360 days    AST  Date Value Ref Range Status  01/18/2021 20 10 - 35 U/L Final   SGOT(AST)  Date Value Ref Range Status  02/12/2014 33 15 - 37 Unit/L Final          Passed - ALT in normal range and within 360 days    ALT  Date Value Ref Range Status  01/18/2021 15 6 - 29 U/L Final   SGPT (ALT)  Date Value Ref Range Status  02/12/2014 24 U/L Final    Comment:    14-63 NOTE: New Reference Range 12/16/13           Passed - Completed PHQ-2 or PHQ-9 in the last 360 days      Passed - Last BP in normal range    BP Readings from Last 1 Encounters:  01/18/21 120/82          Passed - Valid encounter within last 6 months    Recent Outpatient Visits           5 months ago Viral URI   [place], DO   5 months ago 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[facility])   [facility] [place], Drue Stager, MD   10 months ago 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[facility])   [facility] Steele Sizer, MD   1 year ago [place], Clifford D, MD   2 years ago Well adult exam   [facility] Steele Sizer, MD        Future Appointments             In 6 months Steele Sizer, MD [facility]            Signed Prescriptions Disp Refills   omeprazole (PRILOSEC) 40 MG capsule 90 capsule 1    Sig: TAKE ONE CAPSULE BY MOUTH DAILY     Gastroenterology: Proton Pump Inhibitors Passed - 07/15/2021  5:25 PM      Passed - Valid encounter within last 12 months    Recent Outpatient Visits           5 months ago Viral URI   [place], DO   5 months ago 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

## 2021-07-16 NOTE — Telephone Encounter (Signed)
Requested Prescriptions  Pending Prescriptions Disp Refills   buPROPion (WELLBUTRIN XL) 150 MG 24 hr tablet [Pharmacy Med Name: buPROPion HCL XL 150 MG TABLET] 90 tablet 1    Sig: TAKE ONE TABLET BY MOUTH DAILY     Psychiatry: Antidepressants - bupropion Passed - 07/15/2021  5:25 PM      Passed - Cr in normal range and within 360 days    Creat  Date Value Ref Range Status  01/18/2021 0.85 0.50 - 1.03 mg/dL Final         Passed - AST in normal range and within 360 days    AST  Date Value Ref Range Status  01/18/2021 20 10 - 35 U/L Final   SGOT(AST)  Date Value Ref Range Status  02/12/2014 33 15 - 37 Unit/L Final         Passed - ALT in normal range and within 360 days    ALT  Date Value Ref Range Status  01/18/2021 15 6 - 29 U/L Final   SGPT (ALT)  Date Value Ref Range Status  02/12/2014 24 U/L Final    Comment:    14-63 NOTE: New Reference Range 12/16/13          Passed - Completed PHQ-2 or PHQ-9 in the last 360 days      Passed - Last BP in normal range    BP Readings from Last 1 Encounters:  01/18/21 120/82         Passed - Valid encounter within last 6 months    Recent Outpatient Visits          5 months ago Viral URI   [place], DO   5 months ago 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[facility])   [facility] [place], Drue Stager, MD   10 months ago 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[facility])   [facility] Steele Sizer, MD   1 year ago [place], Clifford D, MD   2 years ago Well adult exam   [facility] Steele Sizer, MD      Future Appointments            In 6 months Ancil Boozer, Drue Stager, MD [facility], [facility]            omeprazole (PRILOSEC) 40 MG capsule [Pharmacy Med Name: OMEPRAZOLE DR 40 MG CAPSULE] 90 capsule 1    Sig: TAKE ONE CAPSULE BY MOUTH DAILY     Gastroenterology: Proton Pump Inhibitors Passed  - 07/15/2021  5:25 PM      Passed - Valid encounter within last 12 months    Recent Outpatient Visits          5 months ago Viral URI   [place], DO   5 months ago 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

## 2021-09-21 ENCOUNTER — Other Ambulatory Visit: Payer: Self-pay | Admitting: Family Medicine

## 2021-09-21 DIAGNOSIS — Z1231 Encounter for screening mammogram for malignant neoplasm of breast: Secondary | ICD-10-CM

## 2021-09-26 ENCOUNTER — Other Ambulatory Visit: Payer: Self-pay | Admitting: Family Medicine

## 2021-09-26 DIAGNOSIS — F5104 Psychophysiologic insomnia: Secondary | ICD-10-CM

## 2021-09-26 DIAGNOSIS — F419 Anxiety disorder, unspecified: Secondary | ICD-10-CM

## 2021-09-26 DIAGNOSIS — L509 Urticaria, unspecified: Secondary | ICD-10-CM

## 2021-09-27 ENCOUNTER — Other Ambulatory Visit: Payer: Self-pay | Admitting: Family Medicine

## 2021-09-27 DIAGNOSIS — G8929 Other chronic pain: Secondary | ICD-10-CM

## 2021-09-27 DIAGNOSIS — M47816 Spondylosis without myelopathy or radiculopathy, lumbar region: Secondary | ICD-10-CM

## 2021-09-27 DIAGNOSIS — M5136 Other intervertebral disc degeneration, lumbar region: Secondary | ICD-10-CM

## 2021-10-21 ENCOUNTER — Ambulatory Visit
Admission: RE | Admit: 2021-10-21 | Discharge: 2021-10-21 | Disposition: A | Discharge status: Home / Self Care | Payer: 59 | Source: Ambulatory Visit | Attending: Family Medicine | Admitting: Family Medicine

## 2021-10-21 DIAGNOSIS — Z1231 Encounter for screening mammogram for malignant neoplasm of breast: Secondary | ICD-10-CM | POA: Diagnosis present

## 2021-12-24 ENCOUNTER — Other Ambulatory Visit: Payer: Self-pay | Admitting: Family Medicine

## 2021-12-24 DIAGNOSIS — G8929 Other chronic pain: Secondary | ICD-10-CM

## 2021-12-24 DIAGNOSIS — M5136 Other intervertebral disc degeneration, lumbar region: Secondary | ICD-10-CM

## 2021-12-24 DIAGNOSIS — M47816 Spondylosis without myelopathy or radiculopathy, lumbar region: Secondary | ICD-10-CM

## 2021-12-28 ENCOUNTER — Other Ambulatory Visit: Payer: Self-pay | Admitting: Family Medicine

## 2021-12-28 DIAGNOSIS — F5104 Psychophysiologic insomnia: Secondary | ICD-10-CM

## 2021-12-28 DIAGNOSIS — F419 Anxiety disorder, unspecified: Secondary | ICD-10-CM

## 2021-12-28 DIAGNOSIS — L509 Urticaria, unspecified: Secondary | ICD-10-CM

## 2022-01-12 ENCOUNTER — Other Ambulatory Visit: Payer: Self-pay | Admitting: Family Medicine

## 2022-01-12 DIAGNOSIS — F419 Anxiety disorder, unspecified: Secondary | ICD-10-CM

## 2022-01-12 DIAGNOSIS — F325 Major depressive disorder, single episode, in full remission: Secondary | ICD-10-CM

## 2022-01-19 NOTE — Patient Instructions (Signed)
Preventive Care 40-53 Years Old, Female Preventive care refers to lifestyle choices and visits with your health care provider that can promote health and wellness. Preventive care visits are also called wellness exams. What can I expect for my preventive care visit? Counseling Your health care provider may ask you questions about your: Medical history, including: Past medical problems. Family medical history. Pregnancy history. Current health, including: Menstrual cycle. Method of birth control. Emotional well-being. Home life and relationship well-being. Sexual activity and sexual health. Lifestyle, including: Alcohol, nicotine or tobacco, and drug use. Access to firearms. Diet, exercise, and sleep habits. Work and work environment. Sunscreen use. Safety issues such as seatbelt and bike helmet use. Physical exam Your health care provider will check your: Height and weight. These may be used to calculate your BMI (body mass index). BMI is a measurement that tells if you are at a healthy weight. Waist circumference. This measures the distance around your waistline. This measurement also tells if you are at a healthy weight and may help predict your risk of certain diseases, such as type 2 diabetes and high blood pressure. Heart rate and blood pressure. Body temperature. Skin for abnormal spots. What immunizations do I need?  Vaccines are usually given at various ages, according to a schedule. Your health care provider will recommend vaccines for you based on your age, medical history, and lifestyle or other factors, such as travel or where you work. What tests do I need? Screening Your health care provider may recommend screening tests for certain conditions. This may include: Lipid and cholesterol levels. Diabetes screening. This is done by checking your blood sugar (glucose) after you have not eaten for a while (fasting). Pelvic exam and Pap test. Hepatitis B test. Hepatitis C  test. HIV (human immunodeficiency virus) test. STI (sexually transmitted infection) testing, if you are at risk. Lung cancer screening. Colorectal cancer screening. Mammogram. Talk with your health care provider about when you should start having regular mammograms. This may depend on whether you have a family history of breast cancer. BRCA-related cancer screening. This may be done if you have a family history of breast, ovarian, tubal, or peritoneal cancers. Bone density scan. This is done to screen for osteoporosis. Talk with your health care provider about your test results, treatment options, and if necessary, the need for more tests. Follow these instructions at home: Eating and drinking  Eat a diet that includes fresh fruits and vegetables, whole grains, lean protein, and low-fat dairy products. Take vitamin and mineral supplements as recommended by your health care provider. Do not drink alcohol if: Your health care provider tells you not to drink. You are pregnant, may be pregnant, or are planning to become pregnant. If you drink alcohol: Limit how much you have to 0-1 drink a day. Know how much alcohol is in your drink. In the [place] one drink equals one 12 oz bottle of beer (355 mL), one 5 oz glass of wine (148 mL), or one 1 oz glass of hard liquor (44 mL). Lifestyle Brush your teeth every morning and night with fluoride toothpaste. Floss one time each day. Exercise for at least 30 minutes 5 or more days each week. Do not use any products that contain nicotine or tobacco. These products include cigarettes, chewing tobacco, and vaping devices, such as e-cigarettes. If you need help quitting, ask your health care provider. Do not use drugs. If you are sexually active, practice safe sex. Use a condom or other form of protection to   prevent STIs. If you do not wish to become pregnant, use a form of birth control. If you plan to become pregnant, see your health care provider for a  prepregnancy visit. Take aspirin only as told by your health care provider. Make sure that you understand how much to take and what form to take. Work with your health care provider to find out whether it is safe and beneficial for you to take aspirin daily. Find healthy ways to manage stress, such as: Meditation, yoga, or listening to music. Journaling. Talking to a trusted person. Spending time with friends and family. Minimize exposure to UV radiation to reduce your risk of skin cancer. Safety Always wear your seat belt while driving or riding in a vehicle. Do not drive: If you have been drinking alcohol. Do not ride with someone who has been drinking. When you are tired or distracted. While texting. If you have been using any mind-altering substances or drugs. Wear a helmet and other protective equipment during sports activities. If you have firearms in your house, make sure you follow all gun safety procedures. Seek help if you have been physically or sexually abused. What's next? Visit your health care provider once a year for an annual wellness visit. Ask your health care provider how often you should have your eyes and teeth checked. Stay up to date on all vaccines. This information is not intended to replace advice given to you by your health care provider. Make sure you discuss any questions you have with your health care provider. Document Revised: 11/10/2020 Document Reviewed: 11/10/2020 Elsevier Patient Education  [place].
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
## 2022-01-20 ENCOUNTER — Ambulatory Visit (INDEPENDENT_AMBULATORY_CARE_PROVIDER_SITE_OTHER): Payer: 59 | Admitting: Family Medicine

## 2022-01-20 ENCOUNTER — Encounter: Payer: Self-pay | Admitting: Family Medicine

## 2022-01-20 VITALS — BP 118/64 | HR 91 | Resp 16 | Ht 61.0 in | Wt 119.0 lb

## 2022-01-20 DIAGNOSIS — Z23 Encounter for immunization: Secondary | ICD-10-CM

## 2022-01-20 DIAGNOSIS — F5104 Psychophysiologic insomnia: Secondary | ICD-10-CM

## 2022-01-20 DIAGNOSIS — Z0000 Encounter for general adult medical examination without abnormal findings: Secondary | ICD-10-CM

## 2022-01-20 DIAGNOSIS — F419 Anxiety disorder, unspecified: Secondary | ICD-10-CM

## 2022-01-20 DIAGNOSIS — G8929 Other chronic pain: Secondary | ICD-10-CM

## 2022-01-20 DIAGNOSIS — F325 Major depressive disorder, single episode, in full remission: Secondary | ICD-10-CM

## 2022-01-20 DIAGNOSIS — M545 Low back pain, unspecified: Secondary | ICD-10-CM

## 2022-01-20 DIAGNOSIS — L509 Urticaria, unspecified: Secondary | ICD-10-CM

## 2022-01-20 DIAGNOSIS — M47816 Spondylosis without myelopathy or radiculopathy, lumbar region: Secondary | ICD-10-CM

## 2022-01-20 DIAGNOSIS — K219 Gastro-esophageal reflux disease without esophagitis: Secondary | ICD-10-CM

## 2022-01-20 DIAGNOSIS — M5136 Other intervertebral disc degeneration, lumbar region: Secondary | ICD-10-CM

## 2022-01-20 MED ORDER — GABAPENTIN 300 MG PO CAPS: 300.0000 mg | ORAL_CAPSULE | Freq: Every day | ORAL | 3 refills | Status: DC

## 2022-01-20 MED ORDER — HYDROXYZINE HCL 50 MG PO TABS: 50.0000 mg | ORAL_TABLET | Freq: Every day | ORAL | 3 refills | Status: DC

## 2022-01-20 MED ORDER — BUPROPION HCL ER (XL) 150 MG PO TB24: 150.0000 mg | ORAL_TABLET | Freq: Every day | ORAL | 3 refills | Status: DC

## 2022-01-20 MED ORDER — OMEPRAZOLE 40 MG PO CPDR: 40.0000 mg | DELAYED_RELEASE_CAPSULE | Freq: Every day | ORAL | 3 refills | Status: DC

## 2022-01-23 ENCOUNTER — Encounter: Payer: Self-pay | Admitting: Family Medicine

## 2022-12-27 IMAGING — MG MM DIGITAL SCREENING BILAT W/ TOMO AND CAD
8 series · 9 of 24 positions shown · non-contrast
Comparison: Previous exam(s).

CLINICAL DATA: Screening.

EXAM:
DIGITAL SCREENING BILATERAL MAMMOGRAM WITH TOMOSYNTHESIS AND CAD
TECHNIQUE: Bilateral screening digital craniocaudal and mediolateral oblique
mammograms were obtained. Bilateral screening digital breast
tomosynthesis was performed. The images were evaluated with
computer-aided detection.

[L MLO synth-2D]
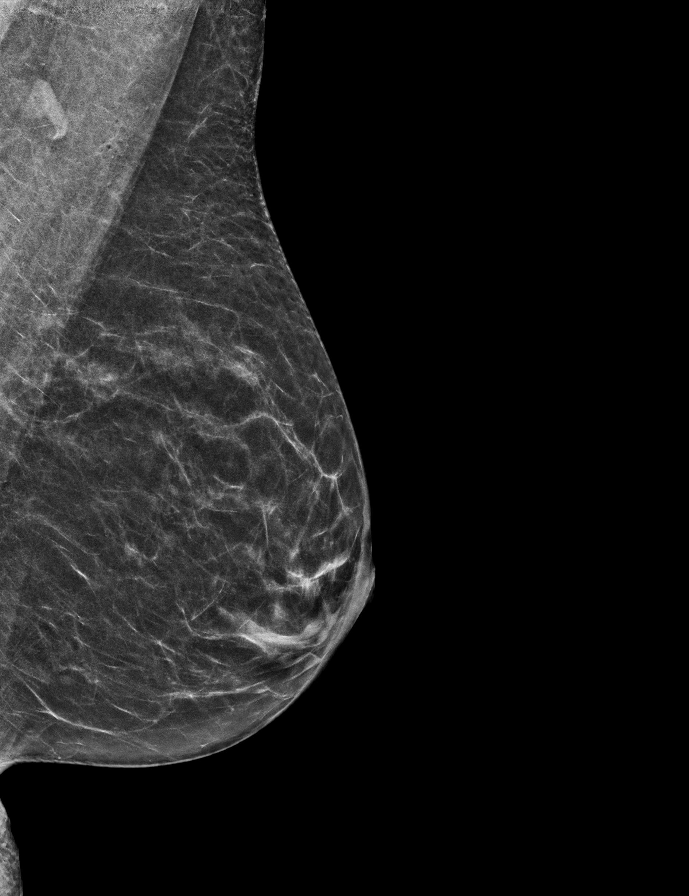

[R CC synth-2D]
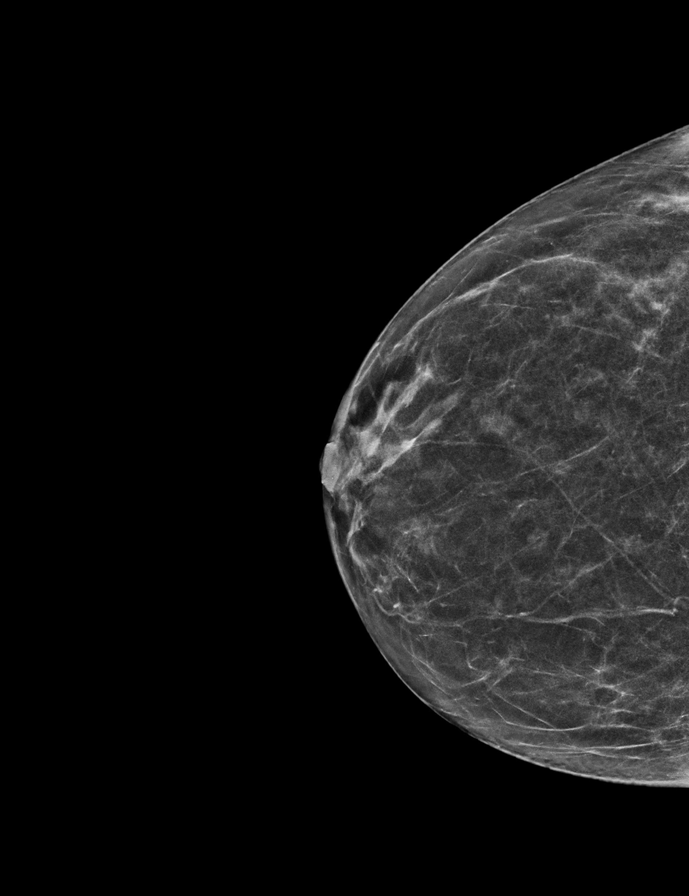

[L CC synth-2D]
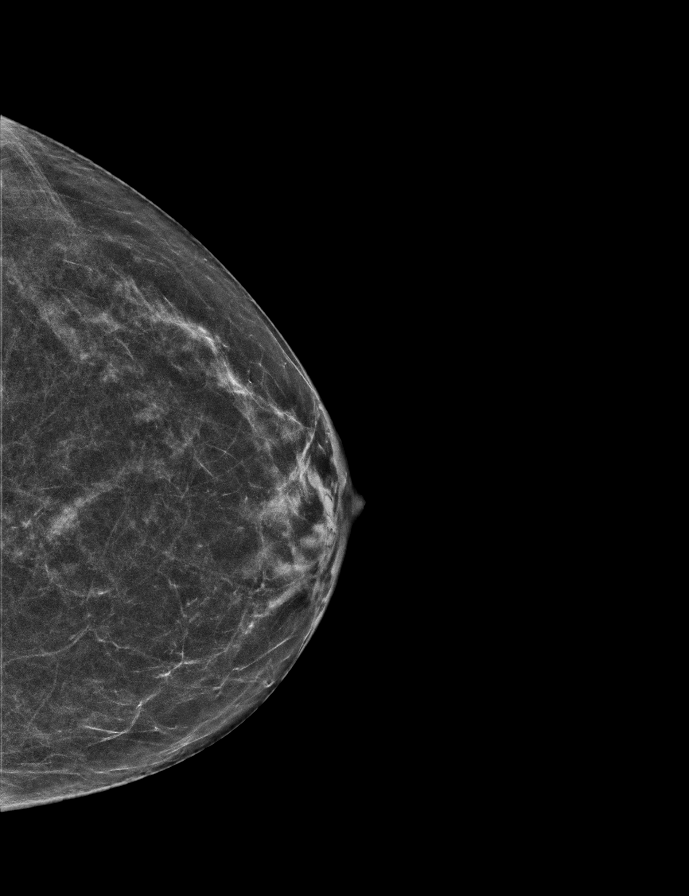

[R MLO synth-2D]
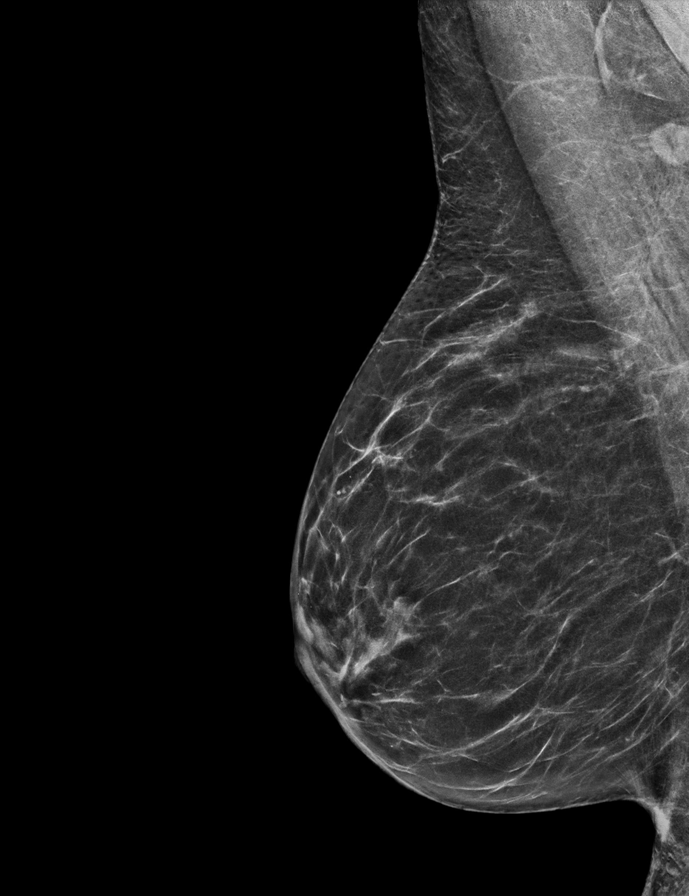

[R CC tomo · 2 of 48 frames shown]
[frame 16/48]
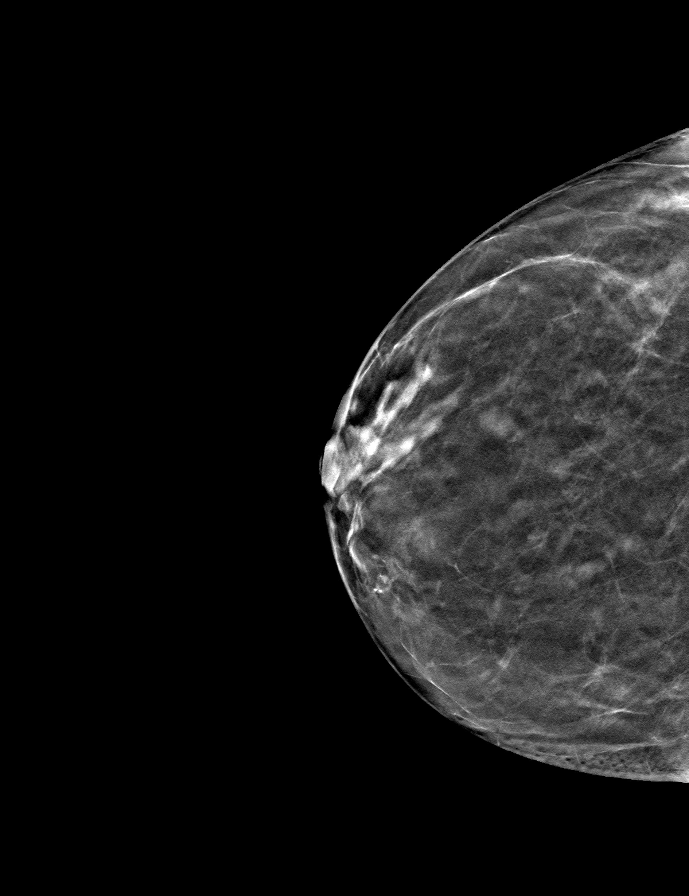
[frame 25/48]
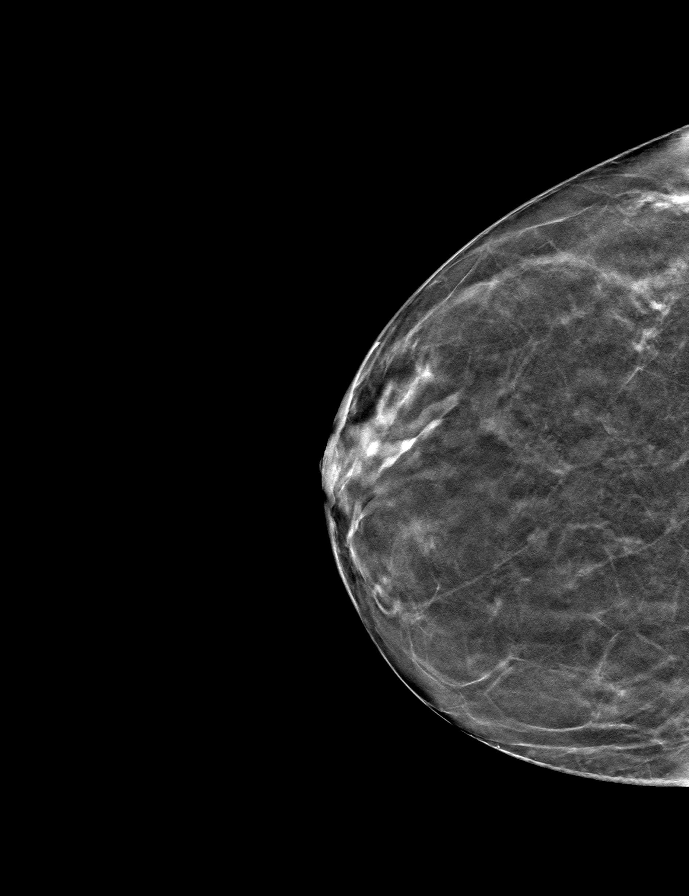

[L CC tomo · tomo slice 25/48.0]
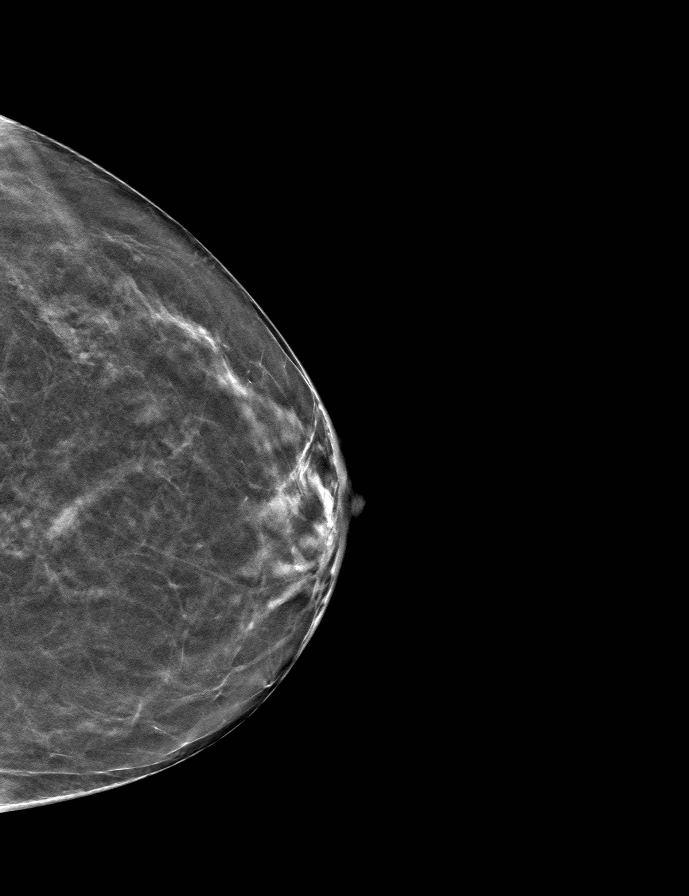

[R MLO tomo · tomo slice 27/53.0]
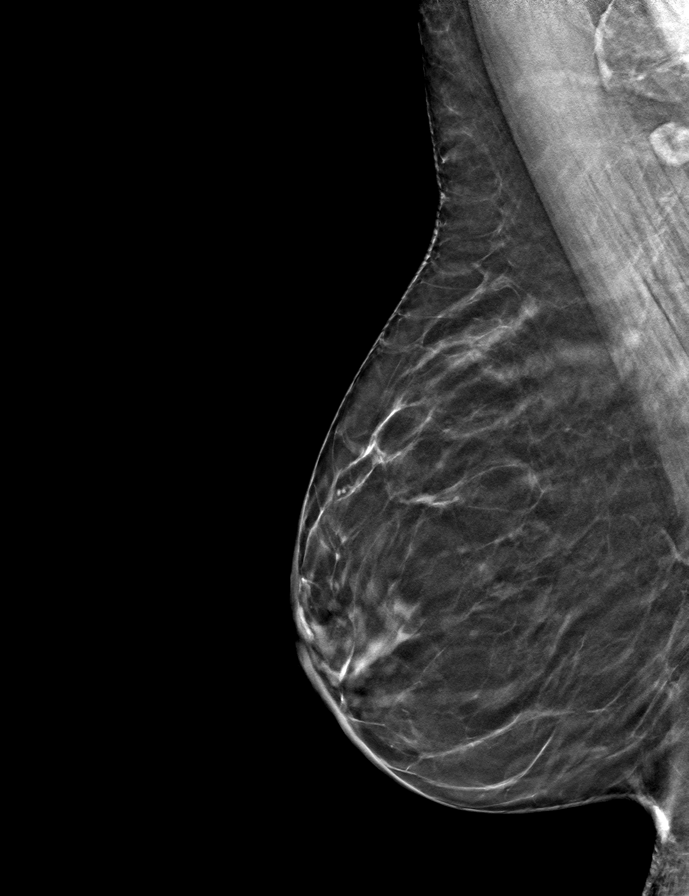

[L MLO tomo · tomo slice 26/51.0]
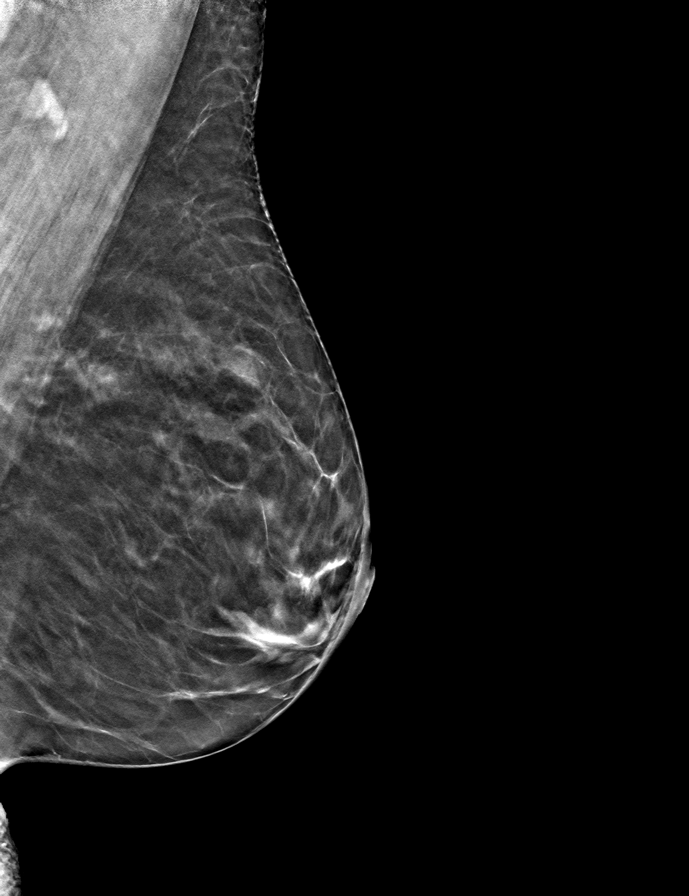

[9 of 24 positions shown; findings below may reference images not displayed]

ACR Breast Density Category b: There are scattered areas of
fibroglandular density.
FINDINGS: There are no findings suspicious for malignancy. The images were
evaluated with computer-aided detection.
IMPRESSION: No mammographic evidence of malignancy. A result letter of this
screening mammogram will be mailed directly to the patient.

RECOMMENDATION:
Screening mammogram in one year. (Code:WJ-I-BG6)

BI-RADS CATEGORY  1: Negative.
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
## 2023-01-23 ENCOUNTER — Ambulatory Visit (INDEPENDENT_AMBULATORY_CARE_PROVIDER_SITE_OTHER): Payer: 59 | Admitting: Family Medicine

## 2023-01-23 ENCOUNTER — Encounter: Payer: Self-pay | Admitting: Family Medicine

## 2023-01-23 ENCOUNTER — Other Ambulatory Visit (HOSPITAL_COMMUNITY)
Admission: RE | Admit: 2023-01-23 | Discharge: 2023-01-23 | Disposition: A | Discharge status: Home / Self Care | Payer: 59 | Source: Ambulatory Visit | Attending: Family Medicine | Admitting: Family Medicine

## 2023-01-23 VITALS — BP 114/64 | HR 95 | Temp 97.9°F | Resp 14 | Ht 62.5 in | Wt 116.7 lb

## 2023-01-23 DIAGNOSIS — Z0000 Encounter for general adult medical examination without abnormal findings: Secondary | ICD-10-CM | POA: Diagnosis present

## 2023-01-23 DIAGNOSIS — J0100 Acute maxillary sinusitis, unspecified: Secondary | ICD-10-CM

## 2023-01-23 DIAGNOSIS — M545 Low back pain, unspecified: Secondary | ICD-10-CM

## 2023-01-23 DIAGNOSIS — Z124 Encounter for screening for malignant neoplasm of cervix: Secondary | ICD-10-CM | POA: Diagnosis present

## 2023-01-23 DIAGNOSIS — L509 Urticaria, unspecified: Secondary | ICD-10-CM

## 2023-01-23 DIAGNOSIS — F325 Major depressive disorder, single episode, in full remission: Secondary | ICD-10-CM

## 2023-01-23 DIAGNOSIS — F419 Anxiety disorder, unspecified: Secondary | ICD-10-CM

## 2023-01-23 DIAGNOSIS — Z1231 Encounter for screening mammogram for malignant neoplasm of breast: Secondary | ICD-10-CM

## 2023-01-23 DIAGNOSIS — N951 Menopausal and female climacteric states: Secondary | ICD-10-CM

## 2023-01-23 DIAGNOSIS — Z0001 Encounter for general adult medical examination with abnormal findings: Secondary | ICD-10-CM

## 2023-01-23 DIAGNOSIS — E785 Hyperlipidemia, unspecified: Secondary | ICD-10-CM

## 2023-01-23 DIAGNOSIS — F5104 Psychophysiologic insomnia: Secondary | ICD-10-CM

## 2023-01-23 DIAGNOSIS — Z79899 Other long term (current) drug therapy: Secondary | ICD-10-CM

## 2023-01-23 DIAGNOSIS — G8929 Other chronic pain: Secondary | ICD-10-CM

## 2023-01-23 MED ORDER — HYDROXYZINE HCL 50 MG PO TABS: 50.0000 mg | ORAL_TABLET | Freq: Every day | ORAL | 3 refills | Status: DC

## 2023-01-23 MED ORDER — AZITHROMYCIN 500 MG PO TABS: 500.0000 mg | ORAL_TABLET | Freq: Every day | ORAL | 0 refills | Status: DC

## 2023-01-23 MED ORDER — BUPROPION HCL ER (XL) 150 MG PO TB24: 150.0000 mg | ORAL_TABLET | Freq: Every day | ORAL | 3 refills | Status: DC

## 2023-01-23 MED ORDER — GABAPENTIN 300 MG PO CAPS: 300.0000 mg | ORAL_CAPSULE | Freq: Every day | ORAL | 3 refills | Status: DC

## 2023-01-24 LAB — COMPLETE METABOLIC PANEL WITH GFR
AG Ratio: 1.4 (calc) (ref 1.0–2.5)
ALT: 30 U/L — ABNORMAL HIGH (ref 6–29)
AST: 34 U/L (ref 10–35)
Albumin: 4.3 g/dL (ref 3.6–5.1)
Alkaline phosphatase (APISO): 101 U/L (ref 37–153)
BUN/Creatinine Ratio: 17 (calc) (ref 6–22)
BUN: 19 mg/dL (ref 7–25)
CO2: 30 mmol/L (ref 20–32)
Calcium: 9.8 mg/dL (ref 8.6–10.4)
Chloride: 101 mmol/L (ref 98–110)
Creat: 1.09 mg/dL — ABNORMAL HIGH (ref 0.50–1.03)
Globulin: 3.1 g/dL (ref 1.9–3.7)
Glucose, Bld: 86 mg/dL (ref 65–99)
Potassium: 4.6 mmol/L (ref 3.5–5.3)
Sodium: 140 mmol/L (ref 135–146)
Total Bilirubin: 0.3 mg/dL (ref 0.2–1.2)
Total Protein: 7.4 g/dL (ref 6.1–8.1)
eGFR: 60 mL/min/{1.73_m2} (ref >=60)

## 2023-01-24 LAB — LIPID PANEL
Cholesterol: 251 mg/dL — ABNORMAL HIGH (ref <200)
HDL: 69 mg/dL (ref >=50)
LDL Cholesterol (Calc): 151 mg/dL — ABNORMAL HIGH
Non-HDL Cholesterol (Calc): 182 mg/dL — ABNORMAL HIGH (ref <130)
Total CHOL/HDL Ratio: 3.6 (calc) (ref <5.0)
Triglycerides: 169 mg/dL — ABNORMAL HIGH (ref <150)

## 2023-01-24 LAB — CBC WITH DIFFERENTIAL/PLATELET
Absolute Monocytes: 501 {cells}/uL (ref 200–950)
Basophils Absolute: 59 {cells}/uL (ref 0–200)
Basophils Relative: 0.9 %
Eosinophils Absolute: 462 cells/uL (ref 15–500)
Eosinophils Relative: 7.1 %
HCT: 41.3 % (ref 35.0–45.0)
Hemoglobin: 13.8 g/dL (ref 11.7–15.5)
Lymphs Abs: 1625 {cells}/uL (ref 850–3900)
MCH: 29.6 pg (ref 27.0–33.0)
MCHC: 33.4 g/dL (ref 32.0–36.0)
MCV: 88.6 fL (ref 80.0–100.0)
MPV: 9.9 fL (ref 7.5–12.5)
Monocytes Relative: 7.7 %
Neutro Abs: 3855 {cells}/uL (ref 1500–7800)
Neutrophils Relative %: 59.3 %
Platelets: 327 10*3/uL (ref 140–400)
RBC: 4.66 10*6/uL (ref 3.80–5.10)
RDW: 12 % (ref 11.0–15.0)
Total Lymphocyte: 25 %
WBC: 6.5 10*3/uL (ref 3.8–10.8)

## 2023-01-24 LAB — FSH/LH
FSH: 45.1 m[IU]/mL
LH: 56.5 m[IU]/mL

## 2023-01-24 LAB — CYTOLOGY - PAP
Comment: NEGATIVE
Diagnosis: NEGATIVE
High risk HPV: NEGATIVE

## 2023-02-05 ENCOUNTER — Ambulatory Visit: Payer: Self-pay

## 2023-02-05 ENCOUNTER — Other Ambulatory Visit: Payer: Self-pay

## 2023-02-05 DIAGNOSIS — J0191 Acute recurrent sinusitis, unspecified: Secondary | ICD-10-CM

## 2023-02-05 NOTE — Telephone Encounter (Signed)
Referral placed and patient aware 

## 2023-02-05 NOTE — Telephone Encounter (Signed)
Message from Phill Myron sent at 02/05/2023 12:01 PM EDT  Summary: still sick, no improvement, finished antibiotics   Still sick, sinus congestion, clogged  no improvement finished antibiotics         Chief Complaint: sinus pressure and headache Symptoms: runny nose and thick yellow secretions to thin clear secretions, ears "popping,"fatigue, nose blocked then alternates with runny nose Frequency: 1 month was seen in office 01/23/23 and has finished abx.  Pertinent Negatives: Patient denies fever, facial pain, sore throat or cough Disposition: [] ED /[] Urgent Care (no appt availability in office) / [] Appointment(In office/virtual)/ []  [facility]/ [] Home Care/ [] Refused Recommended Disposition /[] [facility]/ [x]  Follow-up with PCP Additional Notes: pt seeking advice  Reason for Disposition  [1] Sinus congestion (pressure, fullness) AND [2] present > 10 days  Answer Assessment - Initial Assessment Questions 1. LOCATION: "Where does it hurt?"     headache 2. ONSET: "When did the sinus pain start?"  (e.g., hours, days)   X 1 month  3. SEVERITY: "How bad is the pain?"   (Scale 1-10; mild, moderate or severe)   - MILD (1-3): doesn't interfere with normal activities    - MODERATE (4-7): interferes with normal activities (e.g., work or school) or awakens from sleep   - SEVERE (8-10): excruciating pain and patient unable to do any normal activities        Moderate mainly to clear noise 4. RECURRENT SYMPTOM: "Have you ever had sinus problems before?" If Yes, ask: "When was the last time?" and "What happened that time?"      yes 5. NASAL CONGESTION: "Is the nose blocked?" If Yes, ask: "Can you open it or must you breathe through your mouth?"     Occasional blocked 6. NASAL DISCHARGE: "Do you have discharge from your nose?" If so ask, "What color?"     Yes  7. FEVER: "Do you have a fever?" If Yes, ask: "What is it, how was it measured, and when did it start?"       no 8. OTHER SYMPTOMS: "Do you have any other symptoms?" (e.g., sore throat, cough, earache, difficulty breathing)     Clear  to thick yellow secretions, ears popping, headaches, fatigue  Protocols used: Sinus Pain or Congestion-A-AH

## 2023-02-08 ENCOUNTER — Telehealth: Payer: Self-pay | Admitting: Family Medicine

## 2023-02-08 ENCOUNTER — Other Ambulatory Visit: Payer: Self-pay | Admitting: Family Medicine

## 2023-02-08 DIAGNOSIS — J329 Chronic sinusitis, unspecified: Secondary | ICD-10-CM

## 2023-02-08 MED ORDER — AMOXICILLIN-POT CLAVULANATE 875-125 MG PO TABS: 1.0000 | ORAL_TABLET | Freq: Two times a day (BID) | ORAL | 0 refills | Status: DC

## 2023-02-08 NOTE — Telephone Encounter (Signed)
Pt.notified

## 2023-02-08 NOTE — Telephone Encounter (Signed)
Copied from CRM [id number]. Topic: General - Other >> Feb 08, 2023  9:27 AM Turkey B wrote: Reason for CRM: pt called in about referral to ENT on [address], Dr Allena Katz, She says they can't get her in until the end of October and she can't wait that long. She says she is feeling miserable with the sinus issue. Please call back

## 2023-02-13 NOTE — Telephone Encounter (Signed)
Called patient and informed her we would not know who can see her sooner. Advised her to contact insurance on offices that are covered and can see her sooner.

## 2023-02-13 NOTE — Telephone Encounter (Signed)
Copied from CRM [id number]. Topic: Referral - Request for Referral >> Feb 13, 2023  3:44 PM Phill Myron wrote: Ms Dech currently has an appt with Dr Allena Katz, ENT doctor for the end of October 30 . She ask can you  see if there is someone with a sooner appt. Please advise

## 2023-02-26 ENCOUNTER — Ambulatory Visit: Payer: Self-pay

## 2023-02-26 ENCOUNTER — Ambulatory Visit (INDEPENDENT_AMBULATORY_CARE_PROVIDER_SITE_OTHER): Payer: 59 | Admitting: Physician Assistant

## 2023-02-26 ENCOUNTER — Encounter: Payer: Self-pay | Admitting: Physician Assistant

## 2023-02-26 VITALS — BP 112/68 | HR 90 | Temp 97.7°F | Resp 16 | Ht 62.5 in | Wt 116.3 lb

## 2023-02-26 DIAGNOSIS — J329 Chronic sinusitis, unspecified: Secondary | ICD-10-CM | POA: Insufficient documentation

## 2023-02-26 MED ORDER — RYALTRIS 665-25 MCG/ACT NA SUSP: 2.0000 | Freq: Two times a day (BID) | NASAL | 1 refills | Status: DC

## 2023-02-26 NOTE — Progress Notes (Signed)
Acute Office Visit   Patient: Kara Harrell   DOB: Jul 31, 1970   52 y.o. Female  MRN: [id number] Visit Date: 02/26/2023  Today's healthcare provider: Oswaldo Conroy Ellason Segar, PA-C  Introduced myself to the patient as a Secondary school teacher and provided education on APPs in clinical practice.    Chief Complaint  Patient presents with   Sinusitis    Sinus congestion, clogged nose (3 months) completed second round of antibiotics. Nothing is working/ENT appt at end of the month   Subjective    HPI HPI     Sinusitis    Additional comments: Sinus congestion, clogged nose (3 months) completed second round of antibiotics. Nothing is working/ENT appt at end of the month      Last edited by Benay Pike, CMA on 02/26/2023  3:28 PM.       She reports she has been on 2 rounds of abx for sinusitis  She has completed Zpack and Augmentin but did not have any improvement in symptoms with these regimens She has tried OTC - mucinex, tylenol as well She has been using a saline nasal spray and menthol sinus spray She reports using a nasal decongestant but has not been using these for the past week or so since they did not seem to provide relief She reports sleep disturbance due to difficulty breathing at night     Medications: Outpatient Medications Prior to Visit  Medication Sig   buPROPion (WELLBUTRIN XL) 150 MG 24 hr tablet Take 1 tablet (150 mg total) by mouth daily.   gabapentin (NEURONTIN) 300 MG capsule Take 1 capsule (300 mg total) by mouth at bedtime.   hydrOXYzine (ATARAX) 50 MG tablet Take 1 tablet (50 mg total) by mouth at bedtime.   amoxicillin-clavulanate (AUGMENTIN) 875-125 MG tablet Take 1 tablet by mouth 2 (two) times daily. (Patient not taking: Reported on 02/26/2023)   azithromycin (ZITHROMAX) 500 MG tablet Take 1 tablet (500 mg total) by mouth daily. (Patient not taking: Reported on 02/26/2023)   omeprazole (PRILOSEC) 40 MG capsule Take 1 capsule (40 mg total) by mouth daily.  (Patient not taking: Reported on 01/23/2023)   No facility-administered medications prior to visit.    Review of Systems  Constitutional:  Negative for chills and fever.  HENT:  Positive for congestion, ear pain (ear fullness), facial swelling, sinus pressure and sinus pain. Negative for sore throat.         Objective    BP 112/68 (BP Location: Right Arm, Patient Position: Sitting, Cuff Size: Normal)   Pulse 90   Temp 97.7 F (36.5 C) (Oral)   Resp 16   Ht 5' 2.5" (1.588 m) Comment: per chart  Wt 116 lb 4.8 oz (52.8 kg)   SpO2 93%   BMI 20.93 kg/m     Physical Exam Vitals reviewed.  Constitutional:      General: She is awake.     Appearance: Normal appearance. She is well-developed and well-groomed.  HENT:     Head: Normocephalic and atraumatic.     Right Ear: Hearing and ear canal normal. Tympanic membrane is retracted.     Left Ear: Hearing and ear canal normal. A middle ear effusion is present.     Nose: Congestion present. No nasal tenderness.     Right Turbinates: Enlarged.     Left Turbinates: Enlarged.     Mouth/Throat:     Mouth: Mucous membranes are moist.     Pharynx: No  pharyngeal swelling, oropharyngeal exudate, posterior oropharyngeal erythema, uvula swelling or postnasal drip.  Pulmonary:     Effort: Pulmonary effort is normal.  Lymphadenopathy:     Head:     Right side of head: No submental, submandibular or preauricular adenopathy.     Left side of head: No submental, submandibular or preauricular adenopathy.     Cervical:     Right cervical: No superficial cervical adenopathy.    Left cervical: No superficial cervical adenopathy.     Upper Body:     Right upper body: No supraclavicular adenopathy.     Left upper body: No supraclavicular adenopathy.  Neurological:     Mental Status: She is alert.  Psychiatric:        Attention and Perception: Attention and perception normal.        Mood and Affect: Mood and affect normal.        Speech: Speech  normal.        Behavior: Behavior normal. Behavior is cooperative.       No results found for any visits on 02/26/23.  Assessment & Plan      No follow-ups on file.     Problem List Items Addressed This Visit       Respiratory   Sinusitis - Primary    Acute, on chronic, ongoing  Patient has completed Zpack and round of Augmentin for this without improvement in symptoms Suspect less bacterial vs allergic with potential rhinitis medicamentosa due to nasal decongestant use Patient education offered today on cessation of decongestants Will send in script for Ryaltris nasal spray and recommend she starts daily second gen antihistamine  If unable to get Ryaltris- recommend flonase or Azelastine  She has apt with ENT at the end of Oct- recommend keeping this for evaluation if above measures are not providing relief. Follow up as needed for persistent or progressing symptoms        Relevant Medications   Olopatadine-Mometasone (RYALTRIS) 665-25 MCG/ACT SUSP     No follow-ups on file.   I, Adiana Smelcer E Shahd Occhipinti, PA-C, have reviewed all documentation for this visit. The documentation on 02/26/23 for the exam, diagnosis, procedures, and orders are all accurate and complete.   Jacquelin Hawking, MHS, PA-C [facility]

## 2023-02-26 NOTE — Patient Instructions (Addendum)
I also recommend adding an antihistamine to your daily regimen This includes medications like Claritin, Allegra, Zyrtec- the generics of these work very well and are usually less expensive I recommend using Flonase nasal spray - 2 puffs twice per day to help with your nasal congestion The antihistamines and Flonase can take a few weeks to provide significant relief from allergy symptoms but should start to provide some benefit soon.   I have sent in a script for a medication called Ryaltris  RYALTRIS - Nasal Spray  RYALTRIS is a 2-in-1 nasal spray that starts to deliver multitasking relief of symptoms within 15 minutes

## 2023-02-26 NOTE — Telephone Encounter (Signed)
Summary: sinus congestion   Sinus congestion, clogged nose (3 months) completed second round of antibiotics. Nothing is working     Stage manager Complaint: Continued sinus pain, congestion. Has finished Augmentin. Has appointment with ENT end of October. Asking to be worked in today or tomorrow if possible. No availability. Symptoms: Above Frequency: 3 months ago Pertinent Negatives: Patient denies  Disposition: [] ED /[] Urgent Care (no appt availability in office) / [] Appointment(In office/virtual)/ []  [facility]/ [] Home Care/ [] Refused Recommended Disposition /[] [facility]/ [x]  Follow-up with PCP Additional Notes: Please advise pt.  Reason for Disposition  [1] Taking antibiotic > 7 days AND [2] nasal discharge not improved  Answer Assessment - Initial Assessment Questions 1. ANTIBIOTIC: "What antibiotic are you taking?" "How many times a day?"     Augmentin - finished 2. ONSET: "When was the antibiotic started?"     3 months ago 3. PAIN: "How bad is the sinus pain?"   (Scale 1-10; mild, moderate or severe)   - MILD (1-3): doesn't interfere with normal activities    - MODERATE (4-7): interferes with normal activities (e.g., work or school) or awakens from sleep   - SEVERE (8-10): excruciating pain and patient unable to do any normal activities        10 4. FEVER: "Do you have a fever?" If Yes, ask: "What is it, how was it measured, and when did it start?"      None, but has had fever 5. SYMPTOMS: "Are there any other symptoms you're concerned about?" If Yes, ask: "When did it start?"     Thick mucus 6. PREGNANCY: "Is there any chance you are pregnant?" "When was your last menstrual period?"     No  Protocols used: Sinus Infection on Antibiotic Follow-up Call-[facility]

## 2023-02-26 NOTE — Assessment & Plan Note (Signed)
Acute, on chronic, ongoing  Patient has completed Zpack and round of Augmentin for this without improvement in symptoms Suspect less bacterial vs allergic with potential rhinitis medicamentosa due to nasal decongestant use Patient education offered today on cessation of decongestants Will send in script for Ryaltris nasal spray and recommend she starts daily second gen antihistamine  If unable to get Ryaltris- recommend flonase or Azelastine  She has apt with ENT at the end of Oct- recommend keeping this for evaluation if above measures are not providing relief. Follow up as needed for persistent or progressing symptoms

## 2023-02-27 ENCOUNTER — Ambulatory Visit
Admission: RE | Admit: 2023-02-27 | Discharge: 2023-02-27 | Disposition: A | Discharge status: Home / Self Care | Payer: 59 | Source: Ambulatory Visit | Attending: Family Medicine | Admitting: Family Medicine

## 2023-02-27 DIAGNOSIS — Z1231 Encounter for screening mammogram for malignant neoplasm of breast: Secondary | ICD-10-CM | POA: Diagnosis present

## 2023-02-27 DIAGNOSIS — Z0000 Encounter for general adult medical examination without abnormal findings: Secondary | ICD-10-CM

## 2023-03-28 ENCOUNTER — Ambulatory Visit (INDEPENDENT_AMBULATORY_CARE_PROVIDER_SITE_OTHER): Payer: 59 | Admitting: Otolaryngology

## 2023-03-28 ENCOUNTER — Encounter (INDEPENDENT_AMBULATORY_CARE_PROVIDER_SITE_OTHER): Payer: Self-pay

## 2023-03-28 VITALS — Ht 63.0 in | Wt 116.0 lb

## 2023-03-28 DIAGNOSIS — J3089 Other allergic rhinitis: Secondary | ICD-10-CM

## 2023-03-28 DIAGNOSIS — J343 Hypertrophy of nasal turbinates: Secondary | ICD-10-CM

## 2023-03-28 DIAGNOSIS — J329 Chronic sinusitis, unspecified: Secondary | ICD-10-CM | POA: Diagnosis not present

## 2023-03-28 MED ORDER — PREDNISONE 10 MG PO TABS: 10.0000 mg | ORAL_TABLET | Freq: Every day | ORAL | 0 refills | Status: AC

## 2023-03-28 MED ORDER — AMOXICILLIN-POT CLAVULANATE 875-125 MG PO TABS: 1.0000 | ORAL_TABLET | Freq: Two times a day (BID) | ORAL | 0 refills | Status: AC

## 2023-03-28 NOTE — Patient Instructions (Signed)
Augmentin 875 mg PO BID x10d; take with food, take probiotic or yogurt with it; risks/SE discussed Take Prednisone by mouth (PO) 10mg  x 7 days (1 pill) in the morning, then 5mg  for 3 days (1/2 pill), then stop. Take the ryaltris spray (2 sprays each nostril) both sides for 6 weeks Use sinus irrigations each day for 6 weeks    Lloyd Huger Med Nasal Saline Rinse   - start nasal saline rinses with NeilMed Bottle available over the counter    Nasal Saline Irrigation instructions: If you choose to make your own salt water solution, You will need: Salt (kosher, canning, or pickling salt) Baking soda Nasal irrigation bottle (i.e. Lloyd Huger Med Sinus Rinse) Measuring spoon ( teaspoon) Distilled / boiled water   Mix solution Mix 1 teaspoon of salt, 1/2 teaspoon of baking soda and 1 cup of water into irrigation bottle ** May use saline packet instead of homemade recipe for this step if you prefer If medicine was prescribed to be mixed with solution, place this into bottle Examples 2 inches of 2% mupirocin ointment Budesonide solution Position your head: Lean over sink (about 45 degrees) Rotate head (about 45 degrees) so that one nostril is above the other Irrigate Insert tip of irrigation bottle into upper nostril so it forms a comfortable seal Irrigate while breathing through your mouth May remove the straw from the bottle in order to irrigate the entire solution (important if medicine was added) Exhale through nose when finished and blow nose as necessary  Repeat on opposite side with other 1/2 of solution (120 mL) or remake solution if all 240 mL was used on first side Wash irrigation bottle regularly, replace every 3 months

## 2023-03-28 NOTE — Progress Notes (Signed)
Dear Dr. Carlynn Purl, Here is my assessment for our mutual patient, Kara Harrell. Thank you for allowing me the opportunity to care for your patient. Please do not hesitate to contact me should you have any other questions. Sincerely, Dr. Jovita Kussmaul  Otolaryngology Clinic Note Referring provider: Dr. Carlynn Purl HPI:  Kara Harrell is a 52 y.o. female kindly referred by Dr. Carlynn Purl for evaluation of chornic sinusitis She reports that for about 3 months, she has had nasal symptoms/sinusitis symptoms. Started off with nasal congestion, and then had facial pressure/pain (max, frontal, ethmoid), decreased sense of smell, had some cough, hoarseness. Denies anterior rhinorrhea but some yellow secretions, PND.  No antecedent event. She had two courses of antibiotics (Augmentin and Z-pak), ended September. Before used decongestant spray, sudafed prior, and then prescribed ryaltris spray (mometasone/olopatadine). This helped, especially the steroid spray. She currently includes some ear popping/crackling, headache, some nasal congestion. Congestion is improved but present, but can breathe now. Not all the way back - still some thick drainage. She is currently using zrtec, ryaltris PRN (once or twice per week) No prior typical AR symptoms. No allergy testing before No prior imaging No other prior sinus infections  Seen GI in past for mild dysphagia (2017 and 2020) with mild stenosis s/p dilation, Eosinophilic esophagitis on Omeprazole. H&N Surgery: denies except above Personal or FHx of bleeding dz or anesthesia difficulty: no PMHx: DepressionAnxiety, GERD, Low back pain, HTN  Tobacco - off and on but quit multiple years ago (8 years ago); does vape (with nicotine) Independent Review of Additional Tests or Records:  PCP Notes reviewed: Sinsusitis and URI Sx 3x this year.  Eos 12/2020:3  PMH/Meds/All/SocHx/FamHx/ROS:   Past Medical History:  Diagnosis Date   Anxiety    Depression      Past Surgical  History:  Procedure Laterality Date   CESAREAN SECTION     COLONOSCOPY WITH PROPOFOL N/A 01/24/2019   Procedure: COLONOSCOPY WITH PROPOFOL;  Surgeon: Toney Reil, MD;  Location: [facility] ENDOSCOPY;  Service: Gastroenterology;  Laterality: N/A;   Cyst Removed from ovary     ESOPHAGOGASTRODUODENOSCOPY (EGD) WITH PROPOFOL N/A 10/18/2015   Procedure: ESOPHAGOGASTRODUODENOSCOPY (EGD) WITH PROPOFOL with dialation;  Surgeon: Midge Minium, MD;  Location: [facility] SURGERY CNTR;  Service: Endoscopy;  Laterality: N/A;   ESOPHAGOGASTRODUODENOSCOPY (EGD) WITH PROPOFOL N/A 01/24/2019   Procedure: ESOPHAGOGASTRODUODENOSCOPY (EGD) WITH PROPOFOL;  Surgeon: Toney Reil, MD;  Location: [facility] ENDOSCOPY;  Service: Gastroenterology;  Laterality: N/A;   FOOT SURGERY      Family History  Problem Relation Age of Onset   Cancer Mother        Lung   Diabetes Sister    Breast cancer Neg Hx      Social Connections: Socially Integrated (01/23/2023)   Social Connection and Isolation Panel [NHANES]    Frequency of Communication with Friends and Family: More than three times a week    Frequency of Social Gatherings with Friends and Family: Twice a week    Attends Religious Services: More than 4 times per year    Active Member of [employer] or Organizations: No    Attends Engineer, structural: More than 4 times per year    Marital Status: Married       Current Outpatient Medications:    buPROPion (WELLBUTRIN XL) 150 MG 24 hr tablet, Take 1 tablet (150 mg total) by mouth daily., Disp: 90 tablet, Rfl: 3   gabapentin (NEURONTIN) 300 MG capsule, Take 1 capsule (300 mg total) by mouth at  bedtime., Disp: 90 capsule, Rfl: 3   hydrOXYzine (ATARAX) 50 MG tablet, Take 1 tablet (50 mg total) by mouth at bedtime., Disp: 90 tablet, Rfl: 3   Olopatadine-Mometasone (RYALTRIS) 665-25 MCG/ACT SUSP, Place 2 puffs into the nose in the morning and at bedtime., Disp: 29 g, Rfl: 1   amoxicillin-clavulanate (AUGMENTIN) 875-125  MG tablet, Take 1 tablet by mouth 2 (two) times daily. (Patient not taking: Reported on 02/26/2023), Disp: 20 tablet, Rfl: 0   azithromycin (ZITHROMAX) 500 MG tablet, Take 1 tablet (500 mg total) by mouth daily. (Patient not taking: Reported on 02/26/2023), Disp: 3 tablet, Rfl: 0   omeprazole (PRILOSEC) 40 MG capsule, Take 1 capsule (40 mg total) by mouth daily. (Patient not taking: Reported on 01/23/2023), Disp: 90 capsule, Rfl: 3   Physical Exam:   Ht 5\' 3"  (1.6 m)   Wt 116 lb (52.6 kg)   BMI 20.55 kg/m    Salient findings:  CN II-XII intact  Bilateral EAC clear and TM intact with well pneumatized middle ear spaces Weber 512: midline AC>BC 512 Hz b/l Anterior rhinoscopy: Septum deviates right; bilateral inferior turbinates with modest hypertrophy; given complaints, diagnostic rigid nasal endoscopy was performed No lesions of oral cavity/oropharynx; dentition intact No obviously palpable neck masses/lymphadenopathy/thyromegaly No respiratory distress or stridor  Procedures:  PROCEDURE: Bilateral Diagnostic Rigid Nasal Endoscopy Pre-procedure diagnosis: Concern for sinusitis Post-procedure diagnosis: same Indication: See pre-procedure diagnosis and physical exam above Complications: None apparent EBL: 0 mL Anesthesia: Lidocaine 4% and topical decongestant was topically sprayed in each nasal cavity  Description of Procedure:  Patient was identified. A rigid 0 degree endoscope was utilized to evaluate the sinonasal cavities, mucosa, sinus ostia and turbinates and septum.  Overall, signs of mucosal inflammation are noted.  Also noted are thick secretions around left MT.  No mucopurulence, polyps, or masses noted.   Right Middle meatus: clear but some thick secretions/crusts in the area; no purulence Right SE Recess: clera Left MM: clear Left SE Recess: clear Photodocumentation was obtained.  CPT CODE -- 19147 - Mod 25   Impression & Plans:  Kara Harrell is a 52 y.o. female  with: Chronic other sinusitis Allergic Rhinitis Bilateral inferior turbinate hypertrophy  - Improvement but not full resolution after antibiotics with persistent symptoms including congestion, facial pressure, and some discolored drainage. > 3 months Will do maximal medical treatment Start Augmentin 875 mg PO BID x10d; take with food, take probiotic or yogurt with it; risks/SE discussed Start to Take Prednisone by mouth (PO) 10mg  x 7 days (1 pill) in the morning, then 5mg  for 3 days (1/2 pill), then stop. Continue to Take the ryaltris spray (2 sprays each nostril) both sides for 6 weeks Use sinus irrigations each day for 6 weeks  - f/u 6 weeks; only had one infection so don't think post-treatment scan is needed; if does not improve, will obtain  MDM:  Level 4: 99204 Complexity/Problems addressed: mod - unknown prognosis Data complexity: mod - multiple review of charts, labs - Morbidity: mod - Prescription Drug prescribed or managed: yes   Thank you for allowing me the opportunity to care for your patient. Please do not hesitate to contact me should you have any other questions.  Sincerely, Jovita Kussmaul, MD Otolarynoglogist (ENT), [facility] Specialist Phone: [phone] Fax: [phone]  03/28/2023, 10:33 AM

## 2023-05-08 ENCOUNTER — Encounter (INDEPENDENT_AMBULATORY_CARE_PROVIDER_SITE_OTHER): Payer: Self-pay

## 2023-05-08 ENCOUNTER — Ambulatory Visit (INDEPENDENT_AMBULATORY_CARE_PROVIDER_SITE_OTHER): Payer: 59 | Admitting: Otolaryngology

## 2023-05-08 VITALS — Ht 62.0 in | Wt 116.0 lb

## 2023-05-08 DIAGNOSIS — J3089 Other allergic rhinitis: Secondary | ICD-10-CM

## 2023-05-08 DIAGNOSIS — J343 Hypertrophy of nasal turbinates: Secondary | ICD-10-CM

## 2023-05-08 DIAGNOSIS — J342 Deviated nasal septum: Secondary | ICD-10-CM | POA: Diagnosis not present

## 2023-05-08 DIAGNOSIS — J328 Other chronic sinusitis: Secondary | ICD-10-CM

## 2023-05-08 NOTE — Progress Notes (Signed)
Dear Dr. Carlynn Purl, Here is my assessment for our mutual patient, Kara Harrell. Thank you for allowing me the opportunity to care for your patient. Please do not hesitate to contact me should you have any other questions. Sincerely, Dr. Jovita Kussmaul  Otolaryngology Clinic Note Referring provider: Dr. Carlynn Purl HPI:  Kara Harrell is a 52 y.o. female kindly referred by Dr. Carlynn Purl for evaluation of chornic sinusitis  Initial visit (03/28/2023): She reports that for about 3 months, she has had nasal symptoms/sinusitis symptoms. Started off with nasal congestion, and then had facial pressure/pain (max, frontal, ethmoid), decreased sense of smell, had some cough, hoarseness. Denies anterior rhinorrhea but some yellow secretions, PND.  No antecedent event. She had two courses of antibiotics (Augmentin and Z-pak), ended September. Before used decongestant spray, sudafed prior, and then prescribed ryaltris spray (mometasone/olopatadine). This helped, especially the steroid spray. She currently includes some ear popping/crackling, headache, some nasal congestion. Congestion is improved but present, but can breathe now. Not all the way back - still some thick drainage. She is currently using zrtec, ryaltris PRN (once or twice per week) No prior typical AR symptoms. No allergy testing before No prior imaging No other prior sinus infections  ---------------------------------------------- We decided to treat her maximally medically and then see her in follow up.  04/2023: She finished her antibiotics and steroids. Still doing ryaltris. Doing rinses daily.  She is feeling better, but feels like there is something in her right side of the nose that is bothers her. Facial pressure/pain has resolved, sense of smell is good. No discolored drainage. PND is improved and no thick drainage. Still has a little bit of a scratchy throat and slight dysphonia  ---------------------------------------------- Seen GI in  past for mild dysphagia (2017 and 2020) with mild stenosis s/p dilation, Eosinophilic esophagitis on Omeprazole. H&N Surgery: denies except above Personal or FHx of bleeding dz or anesthesia difficulty: no PMHx: DepressionAnxiety, GERD, Low back pain, HTN  Tobacco - off and on but quit multiple years ago (8 years ago); does vape (with nicotine) Independent Review of Additional Tests or Records:  PCP Notes reviewed (Dr. Carlynn Purl) reviewed and uploaded or available in chart: Dx with Sinsusitis and URI Sx 3x this year, received abx for them (augmentin, Z pack) Eos 12/2020:3  PMH/Meds/All/SocHx/FamHx/ROS:   Past Medical History:  Diagnosis Date   Anxiety    Depression      Past Surgical History:  Procedure Laterality Date   CESAREAN SECTION     COLONOSCOPY WITH PROPOFOL N/A 01/24/2019   Procedure: COLONOSCOPY WITH PROPOFOL;  Surgeon: Toney Reil, MD;  Location: [facility] ENDOSCOPY;  Service: Gastroenterology;  Laterality: N/A;   Cyst Removed from ovary     ESOPHAGOGASTRODUODENOSCOPY (EGD) WITH PROPOFOL N/A 10/18/2015   Procedure: ESOPHAGOGASTRODUODENOSCOPY (EGD) WITH PROPOFOL with dialation;  Surgeon: Midge Minium, MD;  Location: [facility] SUR[facility]ERY [facility];  Service: Endoscopy;  Laterality: N/A;   ESOPHAGOGASTRODUODENOSCOPY (EGD) WITH PROPOFOL N/A 01/24/2019   Procedure: ESOPHAGOGASTRODUODENOSCOPY (EGD) WITH PROPOFOL;  Surgeon: Toney Reil, MD;  Location: [facility] ENDOSCOPY;  Service: Gastroenterology;  Laterality: N/A;   FOOT SURGERY      Family History  Problem Relation Age of Onset   Cancer Mother        Lung   Diabetes Sister    Breast cancer Neg Hx      Social Connections: Socially Integrated (01/23/2023)   Social Connection and Isolation Panel [NHANES]    Frequency of Communication with Friends and Family: More than three times a week  Frequency of Social Gatherings with Friends and Family: Twice a week    Attends Religious Services: More than 4 times per year    Active Member  of Clubs or Organizations: No    Attends Engineer, structural: More than 4 times per year    Marital Status: Married       Current Outpatient Medications:    buPROPion (WELLBUTRIN XL) 150 MG 24 hr tablet, Take 1 tablet (150 mg total) by mouth daily., Disp: 90 tablet, Rfl: 3   gabapentin (NEURONTIN) 300 MG capsule, Take 1 capsule (300 mg total) by mouth at bedtime., Disp: 90 capsule, Rfl: 3   hydrOXYzine (ATARAX) 50 MG tablet, Take 1 tablet (50 mg total) by mouth at bedtime., Disp: 90 tablet, Rfl: 3   meloxicam (MOBIC) 15 MG tablet, Take 15 mg by mouth daily., Disp: , Rfl:    Olopatadine-Mometasone (RYALTRIS) 665-25 MCG/ACT SUSP, Place 2 puffs into the nose in the morning and at bedtime., Disp: 29 g, Rfl: 1   omeprazole (PRILOSEC) 40 MG capsule, Take 1 capsule (40 mg total) by mouth daily., Disp: 90 capsule, Rfl: 3   azithromycin (ZITHROMAX) 500 MG tablet, Take 1 tablet (500 mg total) by mouth daily., Disp: 3 tablet, Rfl: 0   Physical Exam:   Ht 5\' 2"  (1.575 m)   Wt 116 lb (52.6 kg)   BMI 21.22 kg/m    Salient findings:  CN II-XII intact  Bilateral EAC clear and TM intact with well pneumatized middle ear spaces Weber 512: midline AC>BC 512 Hz b/l Anterior rhinoscopy: Septum deviates right; bilateral inferior turbinates with modest hypertrophy; given complaints, diagnostic rigid nasal endoscopy was performed No lesions of oral cavity/oropharynx; dentition intact No obviously palpable neck masses/lymphadenopathy/thyromegaly No respiratory distress or stridor  Procedures:  PROCEDURE: Bilateral Diagnostic Rigid Nasal Endoscopy with Bilateral Debridement Pre-procedure diagnosis: Chronic sinusitis, bilateral; concern for persistence Post-procedure diagnosis: same Indication: See pre-procedure diagnosis and physical exam above Complications: None apparent EBL: 0 mL Anesthesia: Lidocaine 4% and topical decongestant was topically sprayed in each nasal cavity  Description of  Procedure:  Patient was identified. A rigid 30 degree endoscope was utilized to evaluate the sinonasal cavities, mucosa, sinus ostia and turbinates and septum.  Overall, signs of mild mucosal inflammation are noted. Also noted are fair amount of crusting, but limited to bilateral middle turbinates.  No mucopurulence, polyps, or masses noted, but some mucoid secretions around turbinates also suctioned with 8 Fr suction and crusting removed. Right septal deviation Right Middle meatus: clear after removal of crusting Right SE Recess: clear Left MM: clear after crusts removed Left SE Recess: clear      Photodocumentation was obtained.  CPT CODE -- 16109 - Mod 50    Impression & Plans:  Kara Harrell is a 52 y.o. female with: Chronic other sinusitis Allergic Rhinitis Bilateral inferior turbinate hypertrophy Nasal septal deviation  She has improved after medical management, now with mild symptoms. There was some debris and crusting over the middle turbinates which was suctioned to clear the middle meatus area and restore patency. We discussed options, but given infections are not frequent, medical management seems most appropriate.  - Continue Ryaltris - Continue daily sinus irrigations - Discussed f/u, opted for PRN  Doing much better.  F/u PRN  Thank you for allowing me the opportunity to care for your patient. Please do not hesitate to contact me should you have any other questions.  Sincerely, Jovita Kussmaul, MD Otolarynoglogist (ENT), [facility] ENT Specialist Phone:  [phone] Fax: [phone]  05/08/2023, 11:08 AM   MDM:  Level 3: 99213 Complexity/Problems addressed: low - chronic problem, improving Data complexity: low - review of notes - Morbidity: low - Prescription Drug prescribed or managed: no

## 2023-05-11 DIAGNOSIS — M75121 Complete rotator cuff tear or rupture of right shoulder, not specified as traumatic: Secondary | ICD-10-CM | POA: Insufficient documentation

## 2023-05-18 ENCOUNTER — Other Ambulatory Visit (INDEPENDENT_AMBULATORY_CARE_PROVIDER_SITE_OTHER): Payer: Self-pay | Admitting: Otolaryngology

## 2023-05-25 ENCOUNTER — Other Ambulatory Visit: Payer: Self-pay | Admitting: Surgery

## 2023-05-28 ENCOUNTER — Encounter
Admission: RE | Admit: 2023-05-28 | Discharge: 2023-05-28 | Disposition: A | Discharge status: Home / Self Care | Payer: 59 | Source: Ambulatory Visit | Attending: Surgery | Admitting: Surgery

## 2023-05-28 HISTORY — DX: Eosinophilic esophagitis: K20.0

## 2023-05-28 HISTORY — DX: Other cervical disc degeneration, unspecified cervical region: M50.30

## 2023-05-28 HISTORY — DX: Carcinoma in situ of skin of left lower limb, including hip: D04.72

## 2023-05-28 HISTORY — DX: Esophageal obstruction: K22.2

## 2023-05-28 HISTORY — DX: Unspecified rotator cuff tear or rupture of right shoulder, not specified as traumatic: M75.101

## 2023-05-28 MED ORDER — LACTATED RINGERS IV SOLN: INTRAVENOUS | Status: DC

## 2023-05-28 MED ORDER — CHLORHEXIDINE GLUCONATE 0.12 % MT SOLN
15.0000 mL | Freq: Once | OROMUCOSAL | Status: AC
  Administered 2023-05-29: 15 mL via OROMUCOSAL

## 2023-05-28 MED ORDER — ORAL CARE MOUTH RINSE: 15.0000 mL | Freq: Once | OROMUCOSAL | Status: AC

## 2023-05-28 NOTE — Patient Instructions (Addendum)
Your procedure is scheduled on:05-29-23 Tuesday Report to the Registration Desk on the 1st floor of the [facility].Then proceed to the 2nd floor Surgery Desk. Arrive @ 9:30 AM   REMEMBER: Instructions that are not followed completely may result in serious medical risk, up to and including death; or upon the discretion of your surgeon and anesthesiologist your surgery may need to be rescheduled.  Do not eat food after midnight the night before surgery.  No gum chewing or hard candies.  You may however, drink CLEAR liquids up to 2 hours before you are scheduled to arrive for your surgery. Do not drink anything within 2 hours of your scheduled arrival time.  Clear liquids include: - water  - apple juice without pulp - gatorade (not RED colors) - black coffee or tea (Do NOT add milk or creamers to the coffee or tea) Do NOT drink anything that is not on this list.  In addition, your doctor has ordered for you to drink the provided:  Ensure Pre-Surgery Clear Carbohydrate Drink Drinking this carbohydrate drink up to two hours before surgery helps to reduce insulin resistance and improve patient outcomes. Please complete drinking 2 hours before scheduled arrival time.  One week prior to surgery:Stop NOW (05-28-23) Stop Anti-inflammatories (NSAIDS) such as Advil, Aleve, Ibuprofen, Motrin, Naproxen, Naprosyn and Aspirin based products such as Excedrin, Goody's Powder, BC Powder. Stop ANY OVER THE COUNTER supplements until after surgery (Apple Cider Vinegar, Magnesium, Biotin, Multivitamin)  You may however, continue to take Tylenol if needed for pain up until the day of surgery.  Last dose of meloxicam (MOBIC) was 5 days ago (05-23-23)  Continue taking all of your other prescription medications up until the day of surgery.  ON THE DAY OF SURGERY ONLY TAKE THESE MEDICATIONS WITH SIPS OF WATER: -buPROPion (WELLBUTRIN XL)   No Alcohol for 24 hours before or after surgery.  No Smoking  including e-cigarettes for 24 hours before surgery.  No chewable tobacco products for at least 6 hours before surgery.  No nicotine patches on the day of surgery.  Do not use any "recreational" drugs for at least a week (preferably 2 weeks) before your surgery.  Please be advised that the combination of cocaine and anesthesia may have negative outcomes, up to and including death. If you test positive for cocaine, your surgery will be cancelled.  On the morning of surgery brush your teeth with toothpaste and water, you may rinse your mouth with mouthwash if you wish. Do not swallow any toothpaste or mouthwash.  Use CHG Soap as directed on instruction sheet.  Do not wear jewelry, make-up, hairpins, clips or nail polish.  For welded (permanent) jewelry: bracelets, anklets, waist bands, etc.  Please have this removed prior to surgery.  If it is not removed, there is a chance that hospital personnel will need to cut it off on the day of surgery.  Do not wear lotions, powders, or perfumes.   Do not shave body hair from the neck down 48 hours before surgery.  Contact lenses, hearing aids and dentures may not be worn into surgery.  Do not bring valuables to the hospital. [facility] is not responsible for any missing/lost belongings or valuables.   Notify your doctor if there is any change in your medical condition (cold, fever, infection).  Wear comfortable clothing (specific to your surgery type) to the hospital.  After surgery, you can help prevent lung complications by doing breathing exercises.  Take deep breaths and cough every  1-2 hours. Your doctor may order a device called an Incentive Spirometer to help you take deep breaths. When coughing or sneezing, hold a pillow firmly against your incision with both hands. This is called "splinting." Doing this helps protect your incision. It also decreases belly discomfort.  If you are being admitted to the hospital overnight, leave your  suitcase in the car. After surgery it may be brought to your room.  In case of increased patient census, it may be necessary for you, the patient, to continue your postoperative care in the Same Day Surgery department.  If you are being discharged the day of surgery, you will not be allowed to drive home. You will need a responsible individual to drive you home and stay with you for 24 hours after surgery.   If you are taking public transportation, you will need to have a responsible individual with you.  Please call the Pre-admissions Testing Dept. at [phone] if you have any questions about these instructions.  Surgery Visitation Policy:  Patients having surgery or a procedure may have two visitors.  Children under the age of 57 must have an adult with them who is not the patient.     Preparing for Surgery with CHLORHEXIDINE GLUCONATE (CHG) Soap  Chlorhexidine Gluconate (CHG) Soap  o An antiseptic cleaner that kills germs and bonds with the skin to continue killing germs even after washing  o Used for showering the night before surgery and morning of surgery  Before surgery, you can play an important role by reducing the number of germs on your skin.  CHG (Chlorhexidine gluconate) soap is an antiseptic cleanser which kills germs and bonds with the skin to continue killing germs even after washing.  Please do not use if you have an allergy to CHG or antibacterial soaps. If your skin becomes reddened/irritated stop using the CHG.  1. Shower the NIGHT BEFORE SURGERY and the MORNING OF SURGERY with CHG soap.  2. If you choose to wash your hair, wash your hair first as usual with your normal shampoo.  3. After shampooing, rinse your hair and body thoroughly to remove the shampoo.  4. Use CHG as you would any other liquid soap. You can apply CHG directly to the skin and wash gently with a scrungie or a clean washcloth.  5. Apply the CHG soap to your body only from the neck  down. Do not use on open wounds or open sores. Avoid contact with your eyes, ears, mouth, and genitals (private parts). Wash face and genitals (private parts) with your normal soap.  6. Wash thoroughly, paying special attention to the area where your surgery will be performed.  7. Thoroughly rinse your body with warm water.  8. Do not shower/wash with your normal soap after using and rinsing off the CHG soap.  9. Pat yourself dry with a clean towel.  10. Wear clean pajamas to bed the night before surgery.  12. Place clean sheets on your bed the night of your first shower and do not sleep with pets.  13. Shower again with the CHG soap on the day of surgery prior to arriving at the hospital.  14. Do not apply any deodorants/lotions/powders.  15. Please wear clean clothes to the hospital.  How to Use an Incentive Spirometer An incentive spirometer is a tool that measures how well you are filling your lungs with each breath. Learning to take long, deep breaths using this tool can help you keep your lungs  clear and active. This may help to reverse or lessen your chance of developing breathing (pulmonary) problems, especially infection. You may be asked to use a spirometer: After a surgery. If you have a lung problem or a history of smoking. After a long period of time when you have been unable to move or be active. If the spirometer includes an indicator to show the highest number that you have reached, your health care provider or respiratory therapist will help you set a goal. Keep a log of your progress as told by your health care provider. What are the risks? Breathing too quickly may cause dizziness or cause you to pass out. Take your time so you do not get dizzy or light-headed. If you are in pain, you may need to take pain medicine before doing incentive spirometry. It is harder to take a deep breath if you are having pain. How to use your incentive spirometer  Sit up on the edge of  your bed or on a chair. Hold the incentive spirometer so that it is in an upright position. Before you use the spirometer, breathe out normally. Place the mouthpiece in your mouth. Make sure your lips are closed tightly around it. Breathe in slowly and as deeply as you can through your mouth, causing the piston or the ball to rise toward the top of the chamber. Hold your breath for 3-5 seconds, or for as long as possible. If the spirometer includes a coach indicator, use this to guide you in breathing. Slow down your breathing if the indicator goes above the marked areas. Remove the mouthpiece from your mouth and breathe out normally. The piston or ball will return to the bottom of the chamber. Rest for a few seconds, then repeat the steps 10 or more times. Take your time and take a few normal breaths between deep breaths so that you do not get dizzy or light-headed. Do this every 1-2 hours when you are awake. If the spirometer includes a goal marker to show the highest number you have reached (best effort), use this as a goal to work toward during each repetition. After each set of 10 deep breaths, cough a few times. This will help to make sure that your lungs are clear. If you have an incision on your chest or abdomen from surgery, place a pillow or a rolled-up towel firmly against the incision when you cough. This can help to reduce pain while taking deep breaths and coughing. General tips When you are able to get out of bed: Walk around often. Continue to take deep breaths and cough in order to clear your lungs. Keep using the incentive spirometer until your health care provider says it is okay to stop using it. If you have been in the hospital, you may be told to keep using the spirometer at home. Contact a health care provider if: You are having difficulty using the spirometer. You have trouble using the spirometer as often as instructed. Your pain medicine is not giving enough relief for  you to use the spirometer as told. You have a fever. Get help right away if: You develop shortness of breath. You develop a cough with bloody mucus from the lungs. You have fluid or blood coming from an incision site after you cough. Summary An incentive spirometer is a tool that can help you learn to take long, deep breaths to keep your lungs clear and active. You may be asked to use a spirometer after a  surgery, if you have a lung problem or a history of smoking, or if you have been inactive for a long period of time. Use your incentive spirometer as instructed every 1-2 hours while you are awake. If you have an incision on your chest or abdomen, place a pillow or a rolled-up towel firmly against your incision when you cough. This will help to reduce pain. Get help right away if you have shortness of breath, you cough up bloody mucus, or blood comes from your incision when you cough. This information is not intended to replace advice given to you by your health care provider. Make sure you discuss any questions you have with your health care provider. Document Revised: 08/04/2019 Document Reviewed: 08/04/2019 Elsevier Patient Education  2024 [employer].

## 2023-05-29 ENCOUNTER — Encounter: Payer: Self-pay | Admitting: Surgery

## 2023-05-29 ENCOUNTER — Ambulatory Visit: Payer: 59 | Admitting: Certified Registered"

## 2023-05-29 ENCOUNTER — Ambulatory Visit: Payer: 59

## 2023-05-29 ENCOUNTER — Ambulatory Visit
Admission: RE | Admit: 2023-05-29 | Discharge: 2023-05-29 | Disposition: A | Discharge status: Home / Self Care | Payer: 59 | Source: Ambulatory Visit | Attending: Surgery | Admitting: Surgery

## 2023-05-29 ENCOUNTER — Other Ambulatory Visit: Payer: Self-pay

## 2023-05-29 ENCOUNTER — Encounter
Admission: RE | Disposition: A | Discharge status: Home / Self Care | Payer: Self-pay | Source: Ambulatory Visit | Attending: Surgery

## 2023-05-29 DIAGNOSIS — F419 Anxiety disorder, unspecified: Secondary | ICD-10-CM

## 2023-05-29 DIAGNOSIS — I10 Essential (primary) hypertension: Secondary | ICD-10-CM | POA: Insufficient documentation

## 2023-05-29 DIAGNOSIS — K219 Gastro-esophageal reflux disease without esophagitis: Secondary | ICD-10-CM

## 2023-05-29 DIAGNOSIS — M75121 Complete rotator cuff tear or rupture of right shoulder, not specified as traumatic: Secondary | ICD-10-CM | POA: Insufficient documentation

## 2023-05-29 DIAGNOSIS — Z87891 Personal history of nicotine dependence: Secondary | ICD-10-CM | POA: Insufficient documentation

## 2023-05-29 DIAGNOSIS — M67813 Other specified disorders of tendon, right shoulder: Secondary | ICD-10-CM | POA: Diagnosis not present

## 2023-05-29 DIAGNOSIS — M25811 Other specified joint disorders, right shoulder: Secondary | ICD-10-CM | POA: Insufficient documentation

## 2023-05-29 DIAGNOSIS — M129 Arthropathy, unspecified: Secondary | ICD-10-CM | POA: Diagnosis not present

## 2023-05-29 DIAGNOSIS — F325 Major depressive disorder, single episode, in full remission: Secondary | ICD-10-CM

## 2023-05-29 HISTORY — PX: SHOULDER ARTHROSCOPY WITH SUBACROMIAL DECOMPRESSION, ROTATOR CUFF REPAIR AND BICEP TENDON REPAIR: SHX5687

## 2023-05-29 SURGERY — SHOULDER ARTHROSCOPY WITH SUBACROMIAL DECOMPRESSION, ROTATOR CUFF REPAIR AND BICEP TENDON REPAIR
Anesthesia: General | Site: Shoulder | Laterality: Right

## 2023-05-29 MED ORDER — LIDOCAINE HCL (PF) 1 % IJ SOLN
INTRAMUSCULAR | Status: AC
  Filled 2023-05-29: qty 5

## 2023-05-29 MED ORDER — OXYCODONE HCL 5 MG PO TABS: 5.0000 mg | ORAL_TABLET | ORAL | 0 refills | Status: DC | PRN

## 2023-05-29 MED ORDER — ONDANSETRON HCL 4 MG/2ML IJ SOLN: 4.0000 mg | Freq: Four times a day (QID) | INTRAMUSCULAR | Status: DC | PRN

## 2023-05-29 MED ORDER — BUPIVACAINE HCL (PF) 0.5 % IJ SOLN
INTRAMUSCULAR | Status: DC | PRN
  Administered 2023-05-29: 10 mL via PERINEURAL

## 2023-05-29 MED ORDER — DEXTROSE-SODIUM CHLORIDE 5-0.9 % IV SOLN: INTRAVENOUS | Status: DC

## 2023-05-29 MED ORDER — OXYCODONE HCL 5 MG PO TABS
ORAL_TABLET | ORAL | Status: AC
  Filled 2023-05-29: qty 1

## 2023-05-29 MED ORDER — OXYCODONE HCL 5 MG PO TABS: 5.0000 mg | ORAL_TABLET | ORAL | Status: DC | PRN

## 2023-05-29 MED ORDER — PROPOFOL 10 MG/ML IV BOLUS
INTRAVENOUS | Status: DC | PRN
  Administered 2023-05-29: 130 mg via INTRAVENOUS

## 2023-05-29 MED ORDER — KETOROLAC TROMETHAMINE 15 MG/ML IJ SOLN
15.0000 mg | Freq: Once | INTRAMUSCULAR | Status: AC
  Administered 2023-05-29: 15 mg via INTRAVENOUS

## 2023-05-29 MED ORDER — LIDOCAINE HCL (CARDIAC) PF 100 MG/5ML IV SOSY
PREFILLED_SYRINGE | INTRAVENOUS | Status: DC | PRN
  Administered 2023-05-29: 40 mg via INTRAVENOUS

## 2023-05-29 MED ORDER — DEXAMETHASONE SODIUM PHOSPHATE 10 MG/ML IJ SOLN
INTRAMUSCULAR | Status: DC | PRN
  Administered 2023-05-29: 5 mg via INTRAVENOUS

## 2023-05-29 MED ORDER — CEFAZOLIN SODIUM-DEXTROSE 2-4 GM/100ML-% IV SOLN
INTRAVENOUS | Status: AC
  Filled 2023-05-29: qty 100

## 2023-05-29 MED ORDER — PHENYLEPHRINE 80 MCG/ML (10ML) SYRINGE FOR IV PUSH (FOR BLOOD PRESSURE SUPPORT)
PREFILLED_SYRINGE | INTRAVENOUS | Status: DC | PRN
  Administered 2023-05-29 (×2): 80 ug via INTRAVENOUS

## 2023-05-29 MED ORDER — CHLORHEXIDINE GLUCONATE 0.12 % MT SOLN
OROMUCOSAL | Status: AC
  Filled 2023-05-29: qty 15

## 2023-05-29 MED ORDER — EPHEDRINE SULFATE-NACL 50-0.9 MG/10ML-% IV SOSY
PREFILLED_SYRINGE | INTRAVENOUS | Status: DC | PRN
  Administered 2023-05-29 (×3): 10 mg via INTRAVENOUS

## 2023-05-29 MED ORDER — ONDANSETRON HCL 4 MG/2ML IJ SOLN
INTRAMUSCULAR | Status: DC | PRN
  Administered 2023-05-29: 4 mg via INTRAVENOUS

## 2023-05-29 MED ORDER — OXYCODONE HCL 5 MG PO TABS
5.0000 mg | ORAL_TABLET | Freq: Once | ORAL | Status: AC | PRN
  Administered 2023-05-29: 5 mg via ORAL

## 2023-05-29 MED ORDER — KETOROLAC TROMETHAMINE 15 MG/ML IJ SOLN
INTRAMUSCULAR | Status: AC
  Filled 2023-05-29: qty 1

## 2023-05-29 MED ORDER — DEXAMETHASONE SODIUM PHOSPHATE 10 MG/ML IJ SOLN
INTRAMUSCULAR | Status: AC
  Filled 2023-05-29: qty 1

## 2023-05-29 MED ORDER — FENTANYL CITRATE (PF) 100 MCG/2ML IJ SOLN
INTRAMUSCULAR | Status: DC | PRN
  Administered 2023-05-29 (×2): 50 ug via INTRAVENOUS

## 2023-05-29 MED ORDER — LIDOCAINE HCL (PF) 1 % IJ SOLN
INTRAMUSCULAR | Status: DC | PRN
  Administered 2023-05-29: 2 mL via SUBCUTANEOUS

## 2023-05-29 MED ORDER — LACTATED RINGERS IR SOLN
Status: DC | PRN
  Administered 2023-05-29: 3000 mL

## 2023-05-29 MED ORDER — DROPERIDOL 2.5 MG/ML IJ SOLN
0.6250 mg | Freq: Once | INTRAMUSCULAR | Status: DC | PRN
Start: 2023-05-29 — End: 2023-05-29

## 2023-05-29 MED ORDER — METOCLOPRAMIDE HCL 5 MG/ML IJ SOLN: 5.0000 mg | Freq: Three times a day (TID) | INTRAMUSCULAR | Status: DC | PRN

## 2023-05-29 MED ORDER — SUGAMMADEX SODIUM 200 MG/2ML IV SOLN
INTRAVENOUS | Status: DC | PRN
  Administered 2023-05-29: 200 mg via INTRAVENOUS

## 2023-05-29 MED ORDER — ONDANSETRON HCL 4 MG PO TABS: 4.0000 mg | ORAL_TABLET | Freq: Four times a day (QID) | ORAL | Status: DC | PRN

## 2023-05-29 MED ORDER — MIDAZOLAM HCL 2 MG/2ML IJ SOLN
INTRAMUSCULAR | Status: AC
Start: 2023-05-29 — End: ?
  Filled 2023-05-29: qty 2

## 2023-05-29 MED ORDER — PROPOFOL 10 MG/ML IV BOLUS
INTRAVENOUS | Status: AC
  Filled 2023-05-29: qty 20

## 2023-05-29 MED ORDER — FENTANYL CITRATE (PF) 100 MCG/2ML IJ SOLN
INTRAMUSCULAR | Status: AC
  Filled 2023-05-29: qty 2

## 2023-05-29 MED ORDER — ACETAMINOPHEN 325 MG PO TABS: 325.0000 mg | ORAL_TABLET | Freq: Four times a day (QID) | ORAL | Status: DC | PRN

## 2023-05-29 MED ORDER — CEFAZOLIN SODIUM-DEXTROSE 2-4 GM/100ML-% IV SOLN
2.0000 g | INTRAVENOUS | Status: AC
  Administered 2023-05-29: 2 g via INTRAVENOUS

## 2023-05-29 MED ORDER — BUPROPION HCL ER (XL) 150 MG PO TB24: 150.0000 mg | ORAL_TABLET | ORAL | Status: DC

## 2023-05-29 MED ORDER — BUPIVACAINE HCL (PF) 0.5 % IJ SOLN
INTRAMUSCULAR | Status: AC
  Filled 2023-05-29: qty 10

## 2023-05-29 MED ORDER — EPINEPHRINE PF 1 MG/ML IJ SOLN
INTRAMUSCULAR | Status: AC
  Filled 2023-05-29: qty 1

## 2023-05-29 MED ORDER — ONDANSETRON HCL 4 MG/2ML IJ SOLN
INTRAMUSCULAR | Status: AC
  Filled 2023-05-29: qty 2

## 2023-05-29 MED ORDER — ACETAMINOPHEN 10 MG/ML IV SOLN: 1000.0000 mg | Freq: Once | INTRAVENOUS | Status: DC | PRN

## 2023-05-29 MED ORDER — OMEPRAZOLE 40 MG PO CPDR: 40.0000 mg | DELAYED_RELEASE_CAPSULE | Freq: Every day | ORAL | Status: AC | PRN

## 2023-05-29 MED ORDER — OXYCODONE HCL 5 MG/5ML PO SOLN: 5.0000 mg | Freq: Once | ORAL | Status: AC | PRN

## 2023-05-29 MED ORDER — FENTANYL CITRATE (PF) 100 MCG/2ML IJ SOLN: 25.0000 ug | INTRAMUSCULAR | Status: DC | PRN

## 2023-05-29 MED ORDER — BUPIVACAINE-EPINEPHRINE 0.5% -1:200000 IJ SOLN
INTRAMUSCULAR | Status: DC | PRN
  Administered 2023-05-29: 30 mL

## 2023-05-29 MED ORDER — METOCLOPRAMIDE HCL 10 MG PO TABS: 5.0000 mg | ORAL_TABLET | Freq: Three times a day (TID) | ORAL | Status: DC | PRN

## 2023-05-29 MED ORDER — ROCURONIUM BROMIDE 100 MG/10ML IV SOLN
INTRAVENOUS | Status: DC | PRN
  Administered 2023-05-29: 50 mg via INTRAVENOUS

## 2023-05-29 MED ORDER — BUPIVACAINE LIPOSOME 1.3 % IJ SUSP
INTRAMUSCULAR | Status: DC | PRN
  Administered 2023-05-29: 20 mL via PERINEURAL

## 2023-05-29 MED ORDER — BUPIVACAINE LIPOSOME 1.3 % IJ SUSP
INTRAMUSCULAR | Status: AC
  Filled 2023-05-29: qty 20

## 2023-05-29 MED ORDER — BUPIVACAINE HCL (PF) 0.5 % IJ SOLN
INTRAMUSCULAR | Status: AC
  Filled 2023-05-29: qty 30

## 2023-05-29 MED ORDER — MIDAZOLAM HCL 2 MG/2ML IJ SOLN
2.0000 mg | Freq: Once | INTRAMUSCULAR | Status: AC
  Administered 2023-05-29: 1.5 mg via INTRAVENOUS

## 2023-05-29 SURGICAL SUPPLY — 51 items
ANCHOR BONE REGENETEN (Anchor) IMPLANT
ANCHOR HEALICOIL REGEN 5.5 (Anchor) IMPLANT
ANCHOR JUGGERKNOT WTAP NDL 2.9 (Anchor) IMPLANT
ANCHOR QFIX 2.8 SUT MINI TAPE (Anchor) IMPLANT
ANCHOR SUT QUATTRO KNTLS 4.5 (Anchor) IMPLANT
ANCHOR SUT W/ ORTHOCORD (Anchor) IMPLANT
ANCHOR TENDON REGENETEN (Staple) IMPLANT
BIT DRILL JUGRKNT W/NDL BIT2.9 (DRILL) IMPLANT
BLADE FULL RADIUS 3.5 (BLADE) ×1 IMPLANT
BUR ACROMIONIZER 4.0 (BURR) ×1 IMPLANT
CHLORAPREP W/TINT 26 (MISCELLANEOUS) ×1 IMPLANT
COVER MAYO STAND STRL (DRAPES) ×1 IMPLANT
DILATOR 5.5 THREADED HEALICOIL (MISCELLANEOUS) IMPLANT
DRILL JUGGERKNOT W/NDL BIT 2.9 (DRILL) ×1
ELECT CAUTERY BLADE 6.4 (BLADE) ×1 IMPLANT
ELECT REM PT RETURN 9FT ADLT (ELECTROSURGICAL) ×1
ELECTRODE REM PT RTRN 9FT ADLT (ELECTROSURGICAL) ×1 IMPLANT
GAUZE SPONGE 4X4 12PLY STRL (GAUZE/BANDAGES/DRESSINGS) ×1 IMPLANT
GAUZE XEROFORM 1X8 LF (GAUZE/BANDAGES/DRESSINGS) ×1 IMPLANT
GLOVE BIO SURGEON STRL SZ7.5 (GLOVE) ×2 IMPLANT
GLOVE BIO SURGEON STRL SZ8 (GLOVE) ×2 IMPLANT
GLOVE BIOGEL PI IND STRL 8 (GLOVE) ×1 IMPLANT
GLOVE INDICATOR 8.0 STRL GRN (GLOVE) ×1 IMPLANT
GOWN STRL REUS W/ TWL LRG LVL3 (GOWN DISPOSABLE) ×1 IMPLANT
GOWN STRL REUS W/ TWL XL LVL3 (GOWN DISPOSABLE) ×1 IMPLANT
GRASPER SUT 15 45D LOW PRO (SUTURE) IMPLANT
IMPL REGENETEN MEDIUM (Shoulder) IMPLANT
IMPLANT REGENETEN MEDIUM (Shoulder) ×1 IMPLANT
IV LR IRRIG 3000ML ARTHROMATIC (IV SOLUTION) ×2 IMPLANT
KIT CANNULA 8X76-LX IN CANNULA (CANNULA) ×1 IMPLANT
KIT SUTURE 2.8 Q-FIX DISP (MISCELLANEOUS) IMPLANT
MANIFOLD NEPTUNE II (INSTRUMENTS) ×1 IMPLANT
MASK FACE SPIDER DISP (MASK) ×1 IMPLANT
MAT ABSORB FLUID 56X50 GRAY (MISCELLANEOUS) ×1 IMPLANT
PACK ARTHROSCOPY SHOULDER (MISCELLANEOUS) ×1 IMPLANT
PAD ABD DERMACEA PRESS 5X9 (GAUZE/BANDAGES/DRESSINGS) ×2 IMPLANT
PASSER SUT FIRSTPASS SELF (INSTRUMENTS) IMPLANT
SLING ARM LRG DEEP (SOFTGOODS) ×1 IMPLANT
SLING ULTRA II LG (MISCELLANEOUS) ×1 IMPLANT
SPONGE T-LAP 18X18 ~~LOC~~+RFID (SPONGE) ×1 IMPLANT
STAPLER SKIN PROX 35W (STAPLE) ×1 IMPLANT
STRAP SAFETY 5IN WIDE (MISCELLANEOUS) ×1 IMPLANT
SUT ETHIBOND 0 MO6 C/R (SUTURE) ×1 IMPLANT
SUT ULTRABRAID 2 COBRAID 38 (SUTURE) IMPLANT
SUT VIC AB 2-0 CT1 TAPERPNT 27 (SUTURE) ×2 IMPLANT
TAPE MICROFOAM 4IN (TAPE) ×1 IMPLANT
TRAP FLUID SMOKE EVACUATOR (MISCELLANEOUS) ×1 IMPLANT
TUBE SET DOUBLEFLO INFLOW (TUBING) ×1 IMPLANT
TUBING CONNECTING 10 (TUBING) ×1 IMPLANT
WAND WEREWOLF FLOW 90D (MISCELLANEOUS) ×1 IMPLANT
WATER STERILE IRR 500ML POUR (IV SOLUTION) ×1 IMPLANT

## 2023-05-29 NOTE — Anesthesia Procedure Notes (Signed)
 Anesthesia Regional Block: Interscalene brachial plexus block   Pre-Anesthetic Checklist: , timeout performed,  Correct Patient, Correct Site, Correct Laterality,  Correct Procedure, Correct Position, site marked,  Risks and benefits discussed,  Surgical consent,  Pre-op evaluation,  At surgeon's request and post-op pain management  Laterality: Upper and Right  Prep: chloraprep       Needles:  Injection technique: Single-shot  Needle Type: Echogenic Stimulator Needle     Needle Length: 10cm  Needle Gauge: 20     Additional Needles:   Procedures:,,,, ultrasound used (permanent image in chart),,    Narrative:  End time: 05/29/2023 9:55 AM  Performed by: Personally  Anesthesiologist: Myra Lynwood MATSU, MD  Additional Notes: Pt. Identified and accepting of procedure after risks and benefits fully reviewed and questions answered. Time out performed and laterality confirmed prior to procedure.  ISNB  performed without difficulty and well tolerated.  Neg IV and SATD.  No pain on injection of Local anesthetic and VSST.

## 2023-05-29 NOTE — Anesthesia Postprocedure Evaluation (Signed)
 Anesthesia Post Note  Patient: CARELI LUZADER  Procedure(s) Performed: SHOULDER ARTHROSCOPY WITH DEBRIDEMENT, DECOMPRESSION, ROTATOR CUFF REPAIR AND BICEPS TENODESIS. (Right: Shoulder)  Patient location during evaluation: PACU Anesthesia Type: General Level of consciousness: awake and alert Pain management: pain level controlled Vital Signs Assessment: post-procedure vital signs reviewed and stable Respiratory status: spontaneous breathing, nonlabored ventilation, respiratory function stable and patient connected to nasal cannula oxygen Cardiovascular status: blood pressure returned to baseline and stable Postop Assessment: no apparent nausea or vomiting Anesthetic complications: no   No notable events documented.   Last Vitals:  Vitals:   05/29/23 1300 05/29/23 1315  BP: (!) 114/92 116/74  Pulse: (!) 101 94  Resp: 14 15  Temp:    SpO2: 96% 95%    Last Pain:  Vitals:   05/29/23 1258  TempSrc:   PainSc: Asleep                 Lynwood KANDICE Clause

## 2023-05-29 NOTE — Transfer of Care (Signed)
 Immediate Anesthesia Transfer of Care Note  Patient: Kara Harrell  Procedure(s) Performed: SHOULDER ARTHROSCOPY WITH DEBRIDEMENT, DECOMPRESSION, ROTATOR CUFF REPAIR AND BICEPS TENODESIS. (Right: Shoulder)  Patient Location: PACU  Anesthesia Type:General  Level of Consciousness: awake and alert   Airway & Oxygen Therapy: Patient Spontanous Breathing and Patient connected to face mask oxygen  Post-op Assessment: Report given to RN and Post -op Vital signs reviewed and stable  Post vital signs: Reviewed and stable  Last Vitals:  Vitals Value Taken Time  BP 91/74 05/29/23 1258  Temp 36.8 C 05/29/23 1258  Pulse 101 05/29/23 1258  Resp 15 05/29/23 1258  SpO2 96 % 05/29/23 1258  Vitals shown include unfiled device data.  Last Pain:  Vitals:   05/29/23 0929  TempSrc: Temporal      Patients Stated Pain Goal: 0 (05/29/23 0929)  Complications: No notable events documented.

## 2023-05-29 NOTE — Op Note (Signed)
 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  Procedure:   Extensive arthroscopic debridement, arthroscopic subacromial decompression, mini-open rotator cuff repair using Smith & Nephew Regeneten patch, and mini-open biceps tenodesis, right shoulder.  Anesthesia:   General endotracheal with interscalene block using Exparel  placed preoperatively by the anesthesiologist.  Surgeon:   DOROTHA Reyes Maltos, MD  Assistant:   Gustavo Level, PA-C  Findings:   As above. There was a large/massive rotator cuff tear involving the entire supraspinatus and infraspinatus tendons with extension into the superior portion of the subscapularis tendon. The remainder of the rotator cuff was in satisfactory condition. There was grade 1 fraying of the anterior and superior portions of the labrum without frank detachment from the glenoid rim. The biceps tendon demonstrated extensive tendinopathic changes with extensive partial-thickness tearing. The articular surfaces of the glenoid and humerus both were in satisfactory condition.  Complications:   None  Fluids:   600 cc  Estimated blood loss:   15 cc  Tourniquet time:   None  Drains:   None  Closure:   Staples      Brief clinical note:   The patient is a 52 year old female with a history of progressively worsening pain and weakness of her right shoulder. The patient's symptoms have progressed despite medications, activity modification, etc. The patient's history and examination are consistent with impingement/tendinopathy with a large rotator cuff tear. These findings were confirmed by MRI scan. The patient presents at this time for definitive management of these shoulder symptoms.  Procedure:   The patient underwent placement of an interscalene block using Exparel  by the anesthesiologist in the preoperative holding area  before being brought into the operating room and lain in the supine position. The patient then underwent general endotracheal intubation and anesthesia before being repositioned in the beach chair position using the beach chair positioner. The right shoulder and upper extremity were prepped with ChloraPrep solution before being draped sterilely. Preoperative antibiotics were administered. A timeout was performed to confirm the proper surgical site before the expected portal sites and incision site were injected with 0.5% Sensorcaine  with epinephrine .   A posterior portal was created and the glenohumeral joint thoroughly inspected with the findings as described above. An anterior portal was created using an outside-in technique. The labrum and rotator cuff were further probed, again confirming the above-noted findings. Areas of labral fraying were debrided back to stable margins using the full-radius resector, as were the torn portions of the rotator cuff. The exposed portions of the greater and lesser tuberosities also were debrided using the full-radius resector. Finally, areas of synovitis anteriorly and superiorly were debrided back to stable margins using the full-radius resector. The ArthroCare wand was inserted and used to release the biceps tendon from its labral anchor. It also was used to obtain hemostasis as well as to anneal the labrum superiorly and anteriorly. The instruments were removed from the joint after suctioning the excess fluid.  The camera was repositioned through the posterior portal into the subacromial space. A separate lateral portal was created using an outside-in technique. The 3.5 mm full-radius resector was introduced and used to perform a subtotal bursectomy. The ArthroCare wand was then inserted and used to remove the periosteal tissue off the undersurface of the anterior third of the acromion as well as to recess the coracoacromial ligament from its attachment along the  anterior and lateral margins of the acromion. The 4.0 mm acromionizing bur was introduced and used to complete the decompression by removing the undersurface of the anterior third of the acromion. The full radius resector was reintroduced to remove any residual bony debris before the ArthroCare wand was reintroduced to obtain hemostasis. The instruments were then removed from the subacromial space after suctioning the excess fluid.  An approximately 4-5 cm incision was made over the anterolateral aspect of the shoulder beginning at the anterolateral corner of the acromion and extending distally in line with the bicipital groove. This incision was carried down through the subcutaneous tissues to expose the deltoid fascia. The raphae between the anterior and middle thirds was identified and this plane developed to provide access into the subacromial space. Additional bursal tissues were debrided sharply using Metzenbaum scissors. The rotator cuff tear was readily identified. The margins were debrided sharply with a #15 blade and the exposed greater tuberosity roughened with a rongeur. The longitudinal portion of the tear was repaired using three #2 FiberWire sutures placed in a side-to-side fashion to reapproximate the anterior and posterior portions of the tear. The lateral portion of the tear was repaired using three Smith & Nephew 2.8 mm Q-Fix anchors. These sutures were then brought back laterally and secured using two Smith & Nephew Healicoil knotless RegeneSorb anchors to create a two-layer closure. An apparent watertight closure was obtained.  Given the size of the tear, the quality of the rotator cuff tissue, and the patient's age, it was felt best to reinforce/supplement this repair with a Smith & Nephew Regeneten patch in order to give her the best chance of healing.  Therefore, a medium sized Smith & Nephew regenerative tendon patch was applied over the repaired rotator cuff tissue and secured using the  appropriate bony and soft tissue staples.  This portion of the procedure added a measure of complexity to the case as well as an additional 20 to 30 minutes of surgical time.  The bicipital groove was identified by palpation and opened for 1-1.5 cm. The biceps tendon stump was retrieved through this defect. The floor of the bicipital groove was roughened with a curet before a a single Biomet 2.9 mm JuggerKnot anchor was inserted. Both sets of sutures were passed through the biceps tendon and tied securely to effect the tenodesis. The bicipital sheath was reapproximated using two #0 Ethibond interrupted sutures, incorporating the biceps tendon to further reinforce the tenodesis.  The wound was copiously irrigated with sterile saline solution before the deltoid raphae was reapproximated using 2-0 Vicryl interrupted sutures. The subcutaneous tissues were closed in two layers using 2-0 Vicryl interrupted sutures before the skin was closed using staples. The portal sites also were closed using staples. A sterile bulky dressing was applied to the shoulder before the arm was placed into a shoulder immobilizer. The patient was then awakened, extubated, and returned to the recovery room in satisfactory condition after tolerating the procedure well.

## 2023-05-29 NOTE — Discharge Instructions (Addendum)
 Orthopedic discharge instructions: Keep dressing dry and intact.  May shower after dressing changed on post-op day #4 (Saturday).  Cover staples with Band-Aids after drying off. Apply ice frequently to shoulder. Take meloxicam  15 mg daily OR ibuprofen 600-800 mg TID with meals for 3-5 days, then as necessary. Take oxycodone  as prescribed when needed.  May supplement with ES Tylenol  if necessary. Keep shoulder immobilizer on at all times except may remove for bathing purposes. Follow-up in 10-14 days or as scheduled. Information for Discharge Teaching: EXPAREL  (bupivacaine  liposome injectable suspension)   Pain relief is important to your recovery. The goal is to control your pain so you can move easier and return to your normal activities as soon as possible after your procedure. Your physician may use several types of medicines to manage pain, swelling, and more.  Your surgeon or anesthesiologist gave you EXPAREL (bupivacaine ) to help control your pain after surgery.  EXPAREL  is a local anesthetic designed to release slowly over an extended period of time to provide pain relief by numbing the tissue around the surgical site. EXPAREL  is designed to release pain medication over time and can control pain for up to 72 hours. Depending on how you respond to EXPAREL , you may require less pain medication during your recovery. EXPAREL  can help reduce or eliminate the need for opioids during the first few days after surgery when pain relief is needed the most. EXPAREL  is not an opioid and is not addictive. It does not cause sleepiness or sedation.   Important! A teal colored band has been placed on your arm with the date, time and amount of EXPAREL  you have received. Please leave this armband in place for the full 96 hours following administration, and then you may remove the band. If you return to the hospital for any reason within 96 hours following the administration of EXPAREL , the armband provides  important information that your health care providers to know, and alerts them that you have received this anesthetic.    Possible side effects of EXPAREL : Temporary loss of sensation or ability to move in the area where medication was injected. Nausea, vomiting, constipation Rarely, numbness and tingling in your mouth or lips, lightheadedness, or anxiety may occur. Call your doctor right away if you think you may be experiencing any of these sensations, or if you have other questions regarding possible side effects.  Follow all other discharge instructions given to you by your surgeon or nurse. Eat a healthy diet and drink plenty of water  or other fluids.   POLAR CARE INFORMATION  [email or web address]  How to use [employer] Polar Care [facility] Therapy System?  YouTube   [email or web address]  OPERATING INSTRUCTIONS  Start the product With dry hands, connect the transformer to the electrical connection located on the top of the cooler. Next, plug the transformer into an appropriate electrical outlet. The unit will automatically start running at this point.  To stop the pump, disconnect electrical power.  Unplug to stop the product when not in use. Unplugging the Polar Care unit turns it off. Always unplug immediately after use. Never leave it plugged in while unattended. Remove pad.    FIRST ADD WATER  TO FILL LINE, THEN ICE---Replace ice when existing ice is almost melted  1 Discuss Treatment with your Licensed Health Care Practitioner and Use Only as Prescribed 2 Apply Insulation Barrier & Cold Therapy Pad 3 Check for Moisture 4 Inspect Skin Regularly  Tips and Trouble Shooting Usage Tips 1. Use cubed or chunked ice  for optimal performance. 2. It is recommended to drain the Pad between uses. To drain the pad, hold the Pad upright with the hose pointed toward the ground. Depress the black plunger and allow water  to drain out. 3. You may disconnect the Pad from the unit  without removing the pad from the affected area by depressing the silver tabs on the hose coupling and gently pulling the hoses apart. The Pad and unit will seal itself and will not leak. Note: Some dripping during release is normal. 4. DO NOT RUN PUMP WITHOUT WATER ! The pump in this unit is designed to run with water . Running the unit without water  will cause permanent damage to the pump. 5. Unplug unit before removing lid.  TROUBLESHOOTING GUIDE Pump not running, Water  not flowing to the pad, Pad is not getting cold 1. Make sure the transformer is plugged into the wall outlet. 2. Confirm that the ice and water  are filled to the indicated levels. 3. Make sure there are no kinks in the pad. 4. Gently pull on the blue tube to make sure the tube/pad junction is straight. 5. Remove the pad from the treatment site and ll it while the pad is lying at; then reapply. 6. Confirm that the pad couplings are securely attached to the unit. Listen for the double clicks (Figure 1) to confirm the pad couplings are securely attached.  Leaks    Note: Some condensation on the lines, controller, and pads is unavoidable, especially in warmer climates. 1. If using a [employer] Polar Care Cold Therapy unit with a detachable Cold Therapy Pad, and a leak exists (other than condensation on the lines) disconnect the pad couplings. Make sure the silver tabs on the couplings are depressed before reconnecting the pad to the pump hose; then confirm both sides of the coupling are properly clicked in. 2. If the coupling continues to leak or a leak is detected in the pad itself, stop using it and call [employer] Customer Care at [phone].  Cleaning After use, empty and dry the unit with a soft cloth. Warm water  and mild detergent may be used occasionally to clean the pump and tubes.  WARNING: The Polar Care Cube can be cold enough to cause serious injury, including full skin necrosis. Follow these Operating Instructions, and carefully  read the Product Insert (see pouch on side of unit) and the Cold Therapy Pad Fitting Instructions (provided with each Cold Therapy Pad) prior to use.       SHOULDER SLING IMMOBILIZER   VIDEO Slingshot 2 Shoulder Brace Application - YouTube ---[email or web address]  INSTRUCTIONS While supporting the injured arm, slide the forearm into the sling. Wrap the adjustable shoulder strap around the neck and shoulders and attach the strap end to the sling using  the "alligator strap tab."  Adjust the shoulder strap to the required length. Position the shoulder pad behind the neck. To secure the shoulder pad location (optional), pull the shoulder strap away from the shoulder pad, unfold the hook material on the top of the pad, then press the shoulder strap back onto the hook material to secure the pad in place. Attach the closure strap across the open top of the sling. Position the strap so that it holds the arm securely in the sling. Next, attach the thumb strap to the open end of the sling between the thumb and fingers. After sling has been fit, it may be easily removed and reapplied using the quick release buckle on shoulder strap. If a  neutral pillow or 15 abduction pillow is included, place the pillow at the waistline. Attach the sling to the pillow, lining up hook material on the pillow with the loop on sling. Adjust the waist strap to fit.  If waist strap is too long, cut it to fit. Use the small piece of double sided hook material (located on top of the pillow) to secure the strap end. Place the double sided hook material on the inside of the cut strap end and secure it to the waist strap.     If no pillow is included, attach the waist strap to the sling and adjust to fit.    Washing Instructions: Straps and sling must be removed and cleaned regularly depending on your activity level and perspiration. Hand wash straps and sling in cold water  with mild detergent, rinse, air  dry

## 2023-05-29 NOTE — H&P (Signed)
 History of Present Illness: Kara Harrell is a 52 y.o. female who presents for follow-up of her right shoulder pain secondary to impingement/tendinopathy with a large rotator cuff tear. The patient notes that she actually aggravated her shoulder yesterday while getting ready for church. Apparently she felt a pop in her shoulder. Since then, she has had increased pain and is unable to raise her arm even to shoulder level without pain. She rates her pain at 7/10 on today's visit. She is taking Neurontin  and Mobic  with limited benefit. She has pain at night, but denies any numbness or paresthesias down her arm to her hand. Since her last visit, she has undergone an MRI scan and presents today to review these results.  Current Outpatient Medications: azithromycin  (ZITHROMAX ) 500 MG tablet Take 500 mg by mouth once daily buPROPion  (WELLBUTRIN  XL) 150 MG XL tablet Take 1 tablet by mouth once daily gabapentin  (NEURONTIN ) 100 MG capsule 100 mg gabapentin  (NEURONTIN ) 300 MG capsule Take 300 mg by mouth at bedtime hydrOXYzine  (ATARAX ) 50 MG tablet Take by mouth hydrOXYzine  (ATARAX ) 50 MG tablet Take 50 mg by mouth at bedtime meloxicam  (MOBIC ) 15 MG tablet Take 15 mg by mouth once daily omeprazole  (PRILOSEC) 40 MG DR capsule Take 40 mg by mouth once daily predniSONE  (DELTASONE ) 10 MG tablet Take 10 mg by mouth once daily RYALTRIS  665-25 mcg/spray Spry Place 2 inhalations into one nostril triamcinolone 0.1 % cream Apply 0.1 % topically 2 (two) times daily  Allergies: Oxycodone  Nausea and Headache  Past Medical History: Abnormal CT scan, colon Dysphagia Moderate single current episode of major depressive disorder (CMS/HHS-HCC) Stricture and stenosis of esophagus  Past Surgical History: EGD 10/18/2015 (Dr. Jinny, MD; [facility]) COLONOSCOPY 01/24/2019 (Dr. Unk; [facility]) EGD 01/24/2019 (Dr. Unk; [facility]) CESAREAN SECTION CYST REMOVED FROM OVARY FOOT SURGERY  Family History: Asthma Mother High blood  pressure (Hypertension) Mother Kidney disease Mother Lung cancer Mother High blood pressure (Hypertension) Father Diabetes type II Father Kidney disease Father Diabetes Sister  Social History:  Socioeconomic History: Marital status: Married Tobacco Use Smoking status: Former Current packs/day: 0.00 Average packs/day: 0.5 packs/day for 20.0 years (10.0 ttl pk-yrs) Types: Cigarettes Start date: 10/24/1996 Quit date: 10/24/2016 Years since quitting: 6.5 Smokeless tobacco: Never Substance and Sexual Activity Alcohol use: Yes Alcohol/week: 0.0 standard drinks of alcohol Drug use: No Sexual activity: Defer  Social Drivers of Health:  Physicist, Medical Strain: Low Risk (01/23/2023) Received from [facility] Overall Financial Resource Strain (CARDIA) Difficulty of Paying Living Expenses: Not hard at all Food Insecurity: No Food Insecurity (01/23/2023) Received from [facility] Hunger Vital Sign Worried About Running Out of Food in the Last Year: Never true Ran Out of Food in the Last Year: Never true Transportation Needs: No Transportation Needs (01/23/2023) Received from [facility] - Transportation Lack of Transportation (Medical): No Lack of Transportation (Non-Medical): No  Review of Systems: A comprehensive 14 point ROS was performed, reviewed, and the pertinent orthopaedic findings are documented in the HPI.  Physical Exam: Vitals: 05/21/23 1259 05/21/23 1305 BP: 136/78 Weight: 51.3 kg (113 lb) Height: 160 cm (5' 3) PainSc: 7 7 PainLoc: Shoulder Shoulder  General/Constitutional: The patient appears to be well-nourished, well-developed, and in no acute distress. Neuro/Psych: Normal mood and affect, oriented to person, place and time. Eyes: Non-icteric. Pupils are equal, round, and reactive to light, and exhibit synchronous movement. ENT: Unremarkable. Lymphatic: No palpable adenopathy. Respiratory: Lungs clear to auscultation, Normal chest excursion, No  wheezes, and Non-labored breathing Cardiovascular: Regular rate  and rhythm. No murmurs. and No edema, swelling or tenderness, except as noted in detailed exam. Integumentary: No impressive skin lesions present, except as noted in detailed exam. Musculoskeletal: Unremarkable, except as noted in detailed exam.  Right shoulder exam: SKIN: Normal SWELLING: None WARMTH: None LYMPH NODES: No adenopathy palpable CREPITUS: None TENDERNESS: Mild to moderate tenderness to palpation over lateral shoulder ROM (active): Forward flexion: 100 degrees Abduction: 90 degrees Internal rotation: Right buttock ROM (passive): Forward flexion: 140 degrees Abduction: 130 degrees ER/IR at 90 abd: 90 degrees/55 degrees  She describes moderate-severe pain at the extremes of all motions.  STRENGTH: Forward flexion: 3+-4/5 Abduction: 3+-4/5 External rotation: 3+-4/5 Internal rotation: 4/5 Pain with RC testing: Moderate pain with resisted testing in all planes  STABILITY: Normal  SPECIAL TESTS: Vonzell' test: Moderately positive Speed's test: Positive Capsulitis - pain w/ passive ER: No Crossed arm test: Mildly positive Crank: Not evaluated Anterior apprehension: Negative Posterior apprehension: Not evaluated  She remains neurovascularly intact to the right upper extremity.  X-rays/MRI/Lab data: A recent MRI scan of the right shoulder is available for review and has been reviewed by myself. By report, the study demonstrates evidence of a full-thickness tear of the supraspinatus tendon with retraction to the glenohumeral joint. This extends to the anterior fibers of the infraspinatus tendon as well as involving the superior fibers of the subscapularis tendon. The biceps tendon demonstrates partial-thickness tearing with a substantial fluid within the bicipital sheath. Mild atrophy with early fatty infiltration is noted within the supraspinatus muscle belly more so than the infraspinatus muscle belly. Mild  partial-thickness chondral loss within the glenohumeral joint is noted. Both the films and report reviewed by myself and discussed with the patient and her husband.  Assessment: 1. Complete tear of right rotator cuff, unspecified whether traumatic. 2. Early rotator cuff arthropathy, right.  Plan: The treatment options were discussed with the patient and her husband. In addition, patient educational materials were provided regarding the diagnosis and treatment options. The patient is quite frustrated by her symptoms and functional limitations, and is ready to consider more aggressive treatment options. Therefore, especially given her age and the relative health of her glenohumeral joint by MRI scan, I have recommended that we proceed with a surgical intervention to include a right shoulder arthroscopy with debridement, decompression, rotator cuff repair, and biceps tenodesis rather than a reverse total shoulder arthroplasty. The procedure was discussed with the patient, as were the potential risks (including bleeding, infection, nerve and/or blood vessel injury, persistent or recurrent pain, failure of the repair, progression of arthritis, need for further surgery, blood clots, strokes, heart attacks and/or arhythmias, pneumonia, etc.) and benefits. The patient states her understanding and wishes to proceed. All of the patient's questions and concerns were answered. She can call any time with further concerns. She will follow up post-surgery, routine.    H&P reviewed and patient re-examined. No changes.

## 2023-05-29 NOTE — Anesthesia Preprocedure Evaluation (Signed)
 Anesthesia Evaluation  Patient identified by MRN, date of birth, ID band Patient awake    Reviewed: Allergy & Precautions, H&P , NPO status , Patient's Chart, lab work & pertinent test results, reviewed documented beta blocker date and time   Airway Mallampati: II  TM Distance: >3 FB Neck ROM: full    Dental  (+) Teeth Intact   Pulmonary neg pulmonary ROS, former smoker   Pulmonary exam normal        Cardiovascular Exercise Tolerance: Good hypertension, Normal cardiovascular exam Rhythm:regular Rate:Normal     Neuro/Psych  PSYCHIATRIC DISORDERS Anxiety Depression    negative neurological ROS     GI/Hepatic negative GI ROS, Neg liver ROS,,,  Endo/Other  negative endocrine ROS    Renal/GU negative Renal ROS  negative genitourinary   Musculoskeletal   Abdominal   Peds  Hematology negative hematology ROS (+)   Anesthesia Other Findings Past Medical History: No date: Anxiety No date: DDD (degenerative disc disease), cervical No date: Depression No date: Eosinophilic esophagitis No date: Rotator cuff tear, right No date: 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 No date: Stricture and stenosis of esophagus Past Surgical History: No date: CESAREAN SECTION     Comment:  x2 01/24/2019: COLONOSCOPY WITH PROPOFOL ; N/A     Comment:  Procedure: COLONOSCOPY WITH PROPOFOL ;  Surgeon: Unk Corinn Skiff, MD;  Location: [facility] END[facility]COPY;  Service:               Gastroenterology;  Laterality: N/A; 10/18/2015: ESOPHAGOGASTRODUODENOSCOPY (EGD) WITH PROPOFOL ; N/A     Comment:  Procedure: ESOPHAGOGASTRODUODENOSCOPY (EGD) WITH               PROPOFOL  with dialation;  Surgeon: Rogelia Copping, MD;                Location: [facility];  Service: Endoscopy;                Laterality: N/A; 01/24/2019: ESOPHAGOGASTRODUODENOSCOPY (EGD) WITH PROPOFOL ; N/A     Comment:  Procedure:  ESOPHAGOGASTRODUODENOSCOPY (EGD) WITH               PROPOFOL ;  Surgeon: Unk Corinn Skiff, MD;  Location:               [facility] ENDOSCOPY;  Service: Gastroenterology;  Laterality:               N/A; No date: FOOT SURGERY; Bilateral No date: OVARIAN CYST SURGERY BMI    Body Mass Index: 20.36 kg/m     Reproductive/Obstetrics negative OB ROS                             Anesthesia Physical Anesthesia Plan  ASA: 2  Anesthesia Plan: General ETT   Post-op Pain Management: Regional block*   Induction:   PONV Risk Score and Plan: 4 or greater  Airway Management Planned:   Additional Equipment:   Intra-op Plan:   Post-operative Plan:   Informed Consent: I have reviewed the patients History and Physical, chart, labs and discussed the procedure including the risks, benefits and alternatives for the proposed anesthesia with the patient or authorized representative who has indicated his/her understanding and acceptance.     Dental Advisory Given  Plan Discussed with: CRNA  Anesthesia Plan Comments:        Anesthesia Quick Evaluation

## 2023-05-29 NOTE — Anesthesia Procedure Notes (Signed)
 Procedure Name: Intubation Date/Time: 05/29/2023 10:46 AM  Performed by: Niki Manus SAUNDERS, CRNAPre-anesthesia Checklist: Patient identified, Patient being monitored, Timeout performed, Emergency Drugs available and Suction available Patient Re-evaluated:Patient Re-evaluated prior to induction Oxygen Delivery Method: Circle system utilized Preoxygenation: Pre-oxygenation with 100% oxygen Induction Type: IV induction Ventilation: Mask ventilation without difficulty Laryngoscope Size: Mac and 3 Grade View: Grade I Tube type: Oral Tube size: 7.0 mm Number of attempts: 1 Airway Equipment and Method: Stylet Placement Confirmation: ETT inserted through vocal cords under direct vision, positive ETCO2 and breath sounds checked- equal and bilateral Secured at: 21 cm Tube secured with: Tape Dental Injury: Teeth and Oropharynx as per pre-operative assessment

## 2023-05-31 DIAGNOSIS — M7581 Other shoulder lesions, right shoulder: Secondary | ICD-10-CM | POA: Insufficient documentation

## 2023-05-31 DIAGNOSIS — M7521 Bicipital tendinitis, right shoulder: Secondary | ICD-10-CM | POA: Insufficient documentation

## 2023-10-26 ENCOUNTER — Encounter: Payer: Self-pay | Admitting: Family Medicine

## 2023-10-26 ENCOUNTER — Telehealth: Admitting: Family Medicine

## 2023-10-26 DIAGNOSIS — J208 Acute bronchitis due to other specified organisms: Secondary | ICD-10-CM | POA: Diagnosis not present

## 2023-10-26 MED ORDER — HYDROCOD POLI-CHLORPHE POLI ER 10-8 MG/5ML PO SUER: 5.0000 mL | Freq: Every evening | ORAL | 0 refills | Status: DC | PRN

## 2023-10-26 MED ORDER — AIRSUPRA 90-80 MCG/ACT IN AERO: 2.0000 | INHALATION_SPRAY | Freq: Four times a day (QID) | RESPIRATORY_TRACT | 0 refills | Status: DC

## 2023-10-26 NOTE — Progress Notes (Signed)
 Name: Kara Harrell   MRN: [id number]    DOB: 29-Oct-1970   Date:10/26/2023       Progress Note  Subjective  Chief Complaint  Chief Complaint  Patient presents with   Sore Throat   Cough    COUGH AND IN CHEST NOW, STARTED Monday night   Headache   Nasal Congestion    I connected with  [place]  on 10/26/23 at 11:00 AM EDT by a video enabled telemedicine application and verified that I am speaking with the correct person using two identifiers.  I discussed the limitations of evaluation and management by telemedicine and the availability of in person appointments. The patient expressed understanding and agreed to proceed with a virtual visit  Staff also discussed with the patient that there may be a patient responsible charge related to this service. Patient Location: at home  Provider Location: [facility] Additional Individuals present: husband  Discussed the use of AI scribe software for clinical note transcription with the patient, who gave verbal consent to proceed.  History of Present Illness Kara Harrell "[name]" is a 53 year old female who presents with a sore throat and cough.  She has been experiencing a sore throat, cough, and drainage since Monday. The cough is sometimes productive, especially after taking Mucinex , but not consistently. It is worse at night. She has been using Mucinex , ibuprofen, and Tylenol  to manage her symptoms, taking Tylenol  and ibuprofen every six hours. Initially, she felt febrile on Monday.  Her appetite is not very good. She denies shortness of breath and wheezing. She has a history of smoking but has since quit. She has not used an inhaler for similar symptoms in the past, although she believes she was prescribed one a long time ago. She has tried Tessalon  Perles in the past for similar symptoms but found them ineffective.    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 05/01/2019   Hypertension, benign 04/29/2019   Eosinophilic esophagitis 02/20/2019   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(HCC) 07/30/2018   Stricture and stenosis of esophagus     Social History   Tobacco Use   Smoking status: Former    Current packs/day: 0.00    Average packs/day: 0.5 packs/day for 20.0 years (10.0 ttl pk-yrs)    Types: Cigarettes    Start date: 10/24/1996    Quit date: 10/24/2016    Years since quitting: 7.0   Smokeless tobacco: Never  Substance Use Topics   Alcohol use: Yes    Comment: occ     Current Outpatient Medications:    Albuterol -Budesonide (AIRSUPRA ) 90-80 MCG/ACT AERO, Inhale 2 puffs into the lungs in the morning, at noon, in the evening, and at bedtime., Disp: 10.7 g, Rfl: 0   APPLE CIDER VINEGAR PO, Take 1 each by mouth 3 (three) times a week., Disp: , Rfl:    buPROPion  (WELLBUTRIN  XL) 150 MG 24 hr tablet, Take 1 tablet (150 mg total) by mouth every morning., Disp: , Rfl:    chlorpheniramine-HYDROcodone  (TUSSIONEX) 10-8 MG/5ML, Take 5 mLs by mouth at bedtime as needed., Disp: 115 mL, Rfl: 0   hydrOXYzine  (ATARAX ) 50 MG tablet, Take 1 tablet (50 mg total) by mouth at bedtime., Disp: 90 tablet, Rfl:  3   Magnesium 200 MG TABS, Take 200 mg by mouth 3 (three) times a week., Disp: , Rfl:    Multiple Vitamins-Minerals (HAIR/SKIN/NAILS/BIOTIN PO), Take 3 each by mouth daily., Disp: , Rfl:    Multiple Vitamins-Minerals (MULTIVITAMIN WITH MINERALS) tablet, Take 1 tablet by mouth daily. Alive Women's 50+, Disp: , Rfl:    omeprazole  (PRILOSEC) 40 MG capsule, Take 1 capsule (40 mg total) by mouth daily as needed (acid reflux)., Disp: , Rfl:    gabapentin  (NEURONTIN ) 300 MG capsule, Take 1 capsule (300 mg total) by mouth at bedtime. (Patient not taking: Reported on 10/26/2023), Disp: 90 capsule,  Rfl: 3   meloxicam  (MOBIC ) 15 MG tablet, Take 15 mg by mouth at bedtime. (Patient not taking: Reported on 10/26/2023), Disp: , Rfl:    oxyCODONE  (ROXICODONE ) 5 MG immediate release tablet, Take 1-2 tablets (5-10 mg total) by mouth every 4 (four) hours as needed for moderate pain (pain score 4-6) or severe pain (pain score 7-10). (Patient not taking: Reported on 10/26/2023), Disp: 40 tablet, Rfl: 0  No Known Allergies  I personally reviewed active problem list, medication list, allergies, family history with the patient/caregiver today.  ROS  Ten systems reviewed and is negative except as mentioned in HPI    Objective  Virtual encounter, vitals not obtained.  There is no height or weight on file to calculate BMI.  Nursing Note and Vital Signs reviewed.  Physical Exam  Awake, alert and oriented   Assessment & Plan Acute viral bronchitis Acute viral bronchitis likely viral due to family exposure. Smoking increases risk. - Prescribed Airsupra inhaler to reduce lung inflammation. - Prescribed Desenex syrup for nighttime cough suppression. - Continue Mucinex  for mucus relief. - Advised to report worsening symptoms such as increased phlegm, fever, or shortness of breath. - Consider Z-Pak if symptoms worsen or do not improve by Monday.     -Red flags and when to present for emergency care or RTC including fever >101.48F, chest pain, shortness of breath, new/worsening/un-resolving symptoms,  reviewed with patient at time of visit. Follow up and care instructions discussed and provided in AVS. - I discussed the assessment and treatment plan with the patient. The patient was provided an opportunity to ask questions and all were answered. The patient agreed with the plan and demonstrated an understanding of the instructions.  I provided 15  minutes of non-face-to-face time during this encounter.  Floy Angert F Mattie Nordell, MD

## 2023-10-30 ENCOUNTER — Telehealth: Payer: Self-pay

## 2023-10-30 ENCOUNTER — Other Ambulatory Visit: Payer: Self-pay | Admitting: Family Medicine

## 2023-10-30 MED ORDER — AZITHROMYCIN 250 MG PO TABS: ORAL_TABLET | ORAL | 0 refills | Status: AC

## 2023-10-30 NOTE — Telephone Encounter (Signed)
 Pt seen through telemedicine with you on 10/26/23

## 2023-10-30 NOTE — Telephone Encounter (Signed)
 Copied from CRM [id number]. Topic: General - Other >> Oct 30, 2023  3:53 PM Felizardo Hotter wrote: Reason for CRM: Pt was in office on 10/26/2023 states she Is not feeling better and is now congested in sinus with headache. Pt would like a call back at [phone].

## 2023-11-06 ENCOUNTER — Other Ambulatory Visit: Payer: Self-pay | Admitting: Family Medicine

## 2023-11-06 DIAGNOSIS — J208 Acute bronchitis due to other specified organisms: Secondary | ICD-10-CM

## 2023-11-08 ENCOUNTER — Other Ambulatory Visit: Payer: Self-pay | Admitting: Family Medicine

## 2023-11-08 DIAGNOSIS — J208 Acute bronchitis due to other specified organisms: Secondary | ICD-10-CM

## 2024-01-20 ENCOUNTER — Other Ambulatory Visit: Payer: Self-pay | Admitting: Family Medicine

## 2024-01-20 DIAGNOSIS — F325 Major depressive disorder, single episode, in full remission: Secondary | ICD-10-CM

## 2024-01-20 DIAGNOSIS — F419 Anxiety disorder, unspecified: Secondary | ICD-10-CM

## 2024-01-25 ENCOUNTER — Encounter: Payer: Self-pay | Admitting: Family Medicine

## 2024-02-28 ENCOUNTER — Encounter: Payer: Self-pay | Admitting: Family Medicine

## 2024-02-28 DIAGNOSIS — Z1231 Encounter for screening mammogram for malignant neoplasm of breast: Secondary | ICD-10-CM

## 2024-03-06 NOTE — Patient Instructions (Signed)
 Preventive Care 55-55 Years Old, Female  Preventive care refers to lifestyle choices and visits with your health care provider that can promote health and wellness. Preventive care visits are also called wellness exams.  What can I expect for my preventive care visit?  Counseling  Your health care provider may ask you questions about your:  Medical history, including:  Past medical problems.  Family medical history.  Pregnancy history.  Current health, including:  Menstrual cycle.  Method of birth control.  Emotional well-being.  Home life and relationship well-being.  Sexual activity and sexual health.  Lifestyle, including:  Alcohol, nicotine or tobacco, and drug use.  Access to firearms.  Diet, exercise, and sleep habits.  Work and work Astronomer.  Sunscreen use.  Safety issues such as seatbelt and bike helmet use.  Physical exam  Your health care provider will check your:  Height and weight. These may be used to calculate your BMI (body mass index). BMI is a measurement that tells if you are at a healthy weight.  Waist circumference. This measures the distance around your waistline. This measurement also tells if you are at a healthy weight and may help predict your risk of certain diseases, such as type 2 diabetes and high blood pressure.  Heart rate and blood pressure.  Body temperature.  Skin for abnormal spots.  What immunizations do I need?    Vaccines are usually given at various ages, according to a schedule. Your health care provider will recommend vaccines for you based on your age, medical history, and lifestyle or other factors, such as travel or where you work.  What tests do I need?  Screening  Your health care provider may recommend screening tests for certain conditions. This may include:  Lipid and cholesterol levels.  Diabetes screening. This is done by checking your blood sugar (glucose) after you have not eaten for a while (fasting).  Pelvic exam and Pap test.  Hepatitis B test.  Hepatitis C  test.  HIV (human immunodeficiency virus) test.  STI (sexually transmitted infection) testing, if you are at risk.  Lung cancer screening.  Colorectal cancer screening.  Mammogram. Talk with your health care provider about when you should start having regular mammograms. This may depend on whether you have a family history of breast cancer.  BRCA-related cancer screening. This may be done if you have a family history of breast, ovarian, tubal, or peritoneal cancers.  Bone density scan. This is done to screen for osteoporosis.  Talk with your health care provider about your test results, treatment options, and if necessary, the need for more tests.  Follow these instructions at home:  Eating and drinking    Eat a diet that includes fresh fruits and vegetables, whole grains, lean protein, and low-fat dairy products.  Take vitamin and mineral supplements as recommended by your health care provider.  Do not drink alcohol if:  Your health care provider tells you not to drink.  You are pregnant, may be pregnant, or are planning to become pregnant.  If you drink alcohol:  Limit how much you have to 0-1 drink a day.  Know how much alcohol is in your drink. In the [place] one drink equals one 12 oz bottle of beer (355 mL), one 5 oz glass of wine (148 mL), or one 1 oz glass of hard liquor (44 mL).  Lifestyle  Brush your teeth every morning and night with fluoride toothpaste. Floss one time each day.  Exercise for at least  30 minutes 5 or more days each week.  Do not use any products that contain nicotine or tobacco. These products include cigarettes, chewing tobacco, and vaping devices, such as e-cigarettes. If you need help quitting, ask your health care provider.  Do not use drugs.  If you are sexually active, practice safe sex. Use a condom or other form of protection to prevent STIs.  If you do not wish to become pregnant, use a form of birth control. If you plan to become pregnant, see your health care provider for a  prepregnancy visit.  Take aspirin only as told by your health care provider. Make sure that you understand how much to take and what form to take. Work with your health care provider to find out whether it is safe and beneficial for you to take aspirin daily.  Find healthy ways to manage stress, such as:  Meditation, yoga, or listening to music.  Journaling.  Talking to a trusted person.  Spending time with friends and family.  Minimize exposure to UV radiation to reduce your risk of skin cancer.  Safety  Always wear your seat belt while driving or riding in a vehicle.  Do not drive:  If you have been drinking alcohol. Do not ride with someone who has been drinking.  When you are tired or distracted.  While texting.  If you have been using any mind-altering substances or drugs.  Wear a helmet and other protective equipment during sports activities.  If you have firearms in your house, make sure you follow all gun safety procedures.  Seek help if you have been physically or sexually abused.  What's next?  Visit your health care provider once a year for an annual wellness visit.  Ask your health care provider how often you should have your eyes and teeth checked.  Stay up to date on all vaccines.  This information is not intended to replace advice given to you by your health care provider. Make sure you discuss any questions you have with your health care provider.  Document Revised: 11/10/2020 Document Reviewed: 11/10/2020  Elsevier Patient Education  2024 [employer].

## 2024-03-07 ENCOUNTER — Encounter: Payer: Self-pay | Admitting: Family Medicine

## 2024-03-07 ENCOUNTER — Ambulatory Visit (INDEPENDENT_AMBULATORY_CARE_PROVIDER_SITE_OTHER): Admitting: Family Medicine

## 2024-03-07 VITALS — BP 106/68 | HR 97 | Resp 16 | Ht 62.5 in | Wt 116.5 lb

## 2024-03-07 DIAGNOSIS — L509 Urticaria, unspecified: Secondary | ICD-10-CM

## 2024-03-07 DIAGNOSIS — F419 Anxiety disorder, unspecified: Secondary | ICD-10-CM

## 2024-03-07 DIAGNOSIS — F5104 Psychophysiologic insomnia: Secondary | ICD-10-CM

## 2024-03-07 DIAGNOSIS — E2839 Other primary ovarian failure: Secondary | ICD-10-CM | POA: Diagnosis not present

## 2024-03-07 DIAGNOSIS — Z0000 Encounter for general adult medical examination without abnormal findings: Secondary | ICD-10-CM | POA: Diagnosis not present

## 2024-03-07 DIAGNOSIS — Z13820 Encounter for screening for osteoporosis: Secondary | ICD-10-CM

## 2024-03-07 DIAGNOSIS — K219 Gastro-esophageal reflux disease without esophagitis: Secondary | ICD-10-CM

## 2024-03-07 DIAGNOSIS — R0681 Apnea, not elsewhere classified: Secondary | ICD-10-CM | POA: Insufficient documentation

## 2024-03-07 DIAGNOSIS — G8929 Other chronic pain: Secondary | ICD-10-CM

## 2024-03-07 DIAGNOSIS — G44209 Tension-type headache, unspecified, not intractable: Secondary | ICD-10-CM

## 2024-03-07 DIAGNOSIS — Z79899 Other long term (current) drug therapy: Secondary | ICD-10-CM

## 2024-03-07 DIAGNOSIS — E785 Hyperlipidemia, unspecified: Secondary | ICD-10-CM | POA: Diagnosis not present

## 2024-03-07 DIAGNOSIS — Z1231 Encounter for screening mammogram for malignant neoplasm of breast: Secondary | ICD-10-CM

## 2024-03-07 DIAGNOSIS — F325 Major depressive disorder, single episode, in full remission: Secondary | ICD-10-CM

## 2024-03-07 DIAGNOSIS — M545 Low back pain, unspecified: Secondary | ICD-10-CM

## 2024-03-07 LAB — CBC WITH DIFFERENTIAL/PLATELET
Absolute Lymphocytes: 1682 {cells}/uL (ref 850–3900)
Absolute Monocytes: 484 {cells}/uL (ref 200–950)
Basophils Absolute: 53 {cells}/uL (ref 0–200)
Basophils Relative: 0.9 %
Eosinophils Absolute: 271 {cells}/uL (ref 15–500)
Eosinophils Relative: 4.6 %
HCT: 42.1 % (ref 35.0–45.0)
Hemoglobin: 13.8 g/dL (ref 11.7–15.5)
MCH: 30.4 pg (ref 27.0–33.0)
MCHC: 32.8 g/dL (ref 32.0–36.0)
MCV: 92.7 fL (ref 80.0–100.0)
MPV: 10.1 fL (ref 7.5–12.5)
Monocytes Relative: 8.2 %
Neutro Abs: 3410 {cells}/uL (ref 1500–7800)
Neutrophils Relative %: 57.8 %
Platelets: 314 Thousand/uL (ref 140–400)
RBC: 4.54 Million/uL (ref 3.80–5.10)
RDW: 11.6 % (ref 11.0–15.0)
Total Lymphocyte: 28.5 %
WBC: 5.9 Thousand/uL (ref 3.8–10.8)

## 2024-03-07 LAB — COMPREHENSIVE METABOLIC PANEL WITH GFR
AG Ratio: 1.7 (calc) (ref 1.0–2.5)
ALT: 23 U/L (ref 6–29)
AST: 27 U/L (ref 10–35)
Albumin: 4.5 g/dL (ref 3.6–5.1)
Alkaline phosphatase (APISO): 83 U/L (ref 37–153)
BUN: 10 mg/dL (ref 7–25)
CO2: 30 mmol/L (ref 20–32)
Calcium: 10.1 mg/dL (ref 8.6–10.4)
Chloride: 103 mmol/L (ref 98–110)
Creat: 0.76 mg/dL (ref 0.50–1.03)
Globulin: 2.6 g/dL (ref 1.9–3.7)
Glucose, Bld: 64 mg/dL — ABNORMAL LOW (ref 65–99)
Potassium: 4.7 mmol/L (ref 3.5–5.3)
Sodium: 139 mmol/L (ref 135–146)
Total Bilirubin: 0.4 mg/dL (ref 0.2–1.2)
Total Protein: 7.1 g/dL (ref 6.1–8.1)
eGFR: 92 mL/min/1.73m2 (ref >=60)

## 2024-03-07 LAB — LIPID PANEL
Cholesterol: 268 mg/dL — ABNORMAL HIGH (ref <200)
HDL: 79 mg/dL (ref >=50)
LDL Cholesterol (Calc): 162 mg/dL — ABNORMAL HIGH
Non-HDL Cholesterol (Calc): 189 mg/dL — ABNORMAL HIGH (ref <130)
Total CHOL/HDL Ratio: 3.4 (calc) (ref <5.0)
Triglycerides: 147 mg/dL (ref <150)

## 2024-03-07 MED ORDER — HYDROXYZINE HCL 50 MG PO TABS: 50.0000 mg | ORAL_TABLET | Freq: Every day | ORAL | 3 refills | Status: AC

## 2024-03-07 MED ORDER — BACLOFEN 10 MG PO TABS: 10.0000 mg | ORAL_TABLET | Freq: Every evening | ORAL | 1 refills | Status: DC | PRN

## 2024-03-07 MED ORDER — BUPROPION HCL ER (XL) 150 MG PO TB24: 150.0000 mg | ORAL_TABLET | ORAL | 3 refills | Status: AC
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
## 2024-03-10 ENCOUNTER — Ambulatory Visit: Payer: Self-pay | Admitting: Family Medicine

## 2024-04-11 ENCOUNTER — Ambulatory Visit
Admission: RE | Admit: 2024-04-11 | Discharge: 2024-04-11 | Disposition: A | Discharge status: Home / Self Care | Source: Ambulatory Visit | Attending: Family Medicine | Admitting: Family Medicine

## 2024-04-11 DIAGNOSIS — Z1231 Encounter for screening mammogram for malignant neoplasm of breast: Secondary | ICD-10-CM | POA: Insufficient documentation

## 2024-05-14 ENCOUNTER — Other Ambulatory Visit: Payer: Self-pay | Admitting: Family Medicine

## 2024-05-14 DIAGNOSIS — G8929 Other chronic pain: Secondary | ICD-10-CM

## 2024-05-14 MED ORDER — BACLOFEN 10 MG PO TABS: 10.0000 mg | ORAL_TABLET | Freq: Every evening | ORAL | 1 refills | Status: AC | PRN

## 2024-05-14 NOTE — Telephone Encounter (Signed)
 Patient questioning if this medication can be offered in a higher dosage. Routing for review.

## 2024-05-14 NOTE — Telephone Encounter (Signed)
 Copied from CRM #[id number]. Topic: Clinical - Medication Refill >> May 14, 2024  9:19 AM Alexandria E wrote: Medication: baclofen  (LIORESAL ) 10 MG tablet *Patient questioning if this medication can be offered in a higher dosage.  Has the patient contacted their pharmacy? Yes (Agent: If no, request that the patient contact the pharmacy for the refill. If patient does not wish to contact the pharmacy document the reason why and proceed with request.) (Agent: If yes, when and what did the pharmacy advise?)  This is the patient's preferred pharmacy:  [id number] [place], [place] - [address] [place] [address] Phone: [phone] Fax: [phone]  Is this the correct pharmacy for this prescription? Yes If no, delete pharmacy and type the correct one.   Has the prescription been filled recently? No  Is the patient out of the medication? Yes  Has the patient been seen for an appointment in the last year OR does the patient have an upcoming appointment? Yes  Can we respond through MyChart? Yes  Agent: Please be advised that Rx refills may take up to 3 business days. We ask that you follow-up with your pharmacy.

## 2025-03-09 ENCOUNTER — Encounter: Admitting: Family Medicine
